# Patient Record
Sex: Male | Born: 1965 | Race: Black or African American | Hispanic: No | Marital: Married | Smoking: Never smoker
Health system: Southern US, Community
[De-identification: ages and names within clinical notes are randomized; demographics above are authoritative.]

## PROBLEM LIST (undated history)

## (undated) DIAGNOSIS — K219 Gastro-esophageal reflux disease without esophagitis: Secondary | ICD-10-CM

## (undated) DIAGNOSIS — E119 Type 2 diabetes mellitus without complications: Secondary | ICD-10-CM

## (undated) DIAGNOSIS — F419 Anxiety disorder, unspecified: Secondary | ICD-10-CM

## (undated) DIAGNOSIS — I10 Essential (primary) hypertension: Secondary | ICD-10-CM

## (undated) DIAGNOSIS — F32A Depression, unspecified: Secondary | ICD-10-CM

## (undated) DIAGNOSIS — H269 Unspecified cataract: Secondary | ICD-10-CM

## (undated) DIAGNOSIS — F329 Major depressive disorder, single episode, unspecified: Secondary | ICD-10-CM

## (undated) HISTORY — DX: Gastro-esophageal reflux disease without esophagitis: K21.9

## (undated) HISTORY — DX: Depression, unspecified: F32.A

## (undated) HISTORY — DX: Unspecified cataract: H26.9

## (undated) HISTORY — DX: Anxiety disorder, unspecified: F41.9

## (undated) HISTORY — DX: Major depressive disorder, single episode, unspecified: F32.9

## (undated) HISTORY — DX: Type 2 diabetes mellitus without complications: E11.9

## (undated) HISTORY — DX: Essential (primary) hypertension: I10

---

## 2002-04-04 ENCOUNTER — Emergency Department (HOSPITAL_COMMUNITY): Admission: EM | Admit: 2002-04-04 | Discharge: 2002-04-04 | Payer: Self-pay | Admitting: Emergency Medicine

## 2003-01-17 ENCOUNTER — Encounter: Payer: Self-pay | Admitting: Emergency Medicine

## 2003-01-17 ENCOUNTER — Emergency Department (HOSPITAL_COMMUNITY): Admission: EM | Admit: 2003-01-17 | Discharge: 2003-01-17 | Payer: Self-pay | Admitting: Emergency Medicine

## 2004-02-13 ENCOUNTER — Emergency Department (HOSPITAL_COMMUNITY): Admission: EM | Admit: 2004-02-13 | Discharge: 2004-02-13 | Payer: Self-pay | Admitting: Emergency Medicine

## 2004-03-03 ENCOUNTER — Emergency Department (HOSPITAL_COMMUNITY): Admission: EM | Admit: 2004-03-03 | Discharge: 2004-03-04 | Payer: Self-pay | Admitting: Emergency Medicine

## 2004-03-04 ENCOUNTER — Ambulatory Visit (HOSPITAL_COMMUNITY): Admission: RE | Admit: 2004-03-04 | Discharge: 2004-03-04 | Payer: Self-pay | Admitting: Emergency Medicine

## 2004-06-01 ENCOUNTER — Emergency Department (HOSPITAL_COMMUNITY): Admission: EM | Admit: 2004-06-01 | Discharge: 2004-06-01 | Payer: Self-pay | Admitting: Emergency Medicine

## 2004-06-04 ENCOUNTER — Ambulatory Visit (HOSPITAL_COMMUNITY): Admission: RE | Admit: 2004-06-04 | Discharge: 2004-06-04 | Payer: Self-pay | Admitting: Emergency Medicine

## 2004-06-10 ENCOUNTER — Emergency Department (HOSPITAL_COMMUNITY): Admission: EM | Admit: 2004-06-10 | Discharge: 2004-06-10 | Payer: Self-pay | Admitting: *Deleted

## 2004-08-12 ENCOUNTER — Emergency Department (HOSPITAL_COMMUNITY): Admission: EM | Admit: 2004-08-12 | Discharge: 2004-08-12 | Payer: Self-pay | Admitting: Emergency Medicine

## 2004-08-13 ENCOUNTER — Emergency Department (HOSPITAL_COMMUNITY): Admission: EM | Admit: 2004-08-13 | Discharge: 2004-08-13 | Payer: Self-pay | Admitting: Emergency Medicine

## 2004-08-14 ENCOUNTER — Ambulatory Visit: Payer: Self-pay | Admitting: Internal Medicine

## 2004-09-10 ENCOUNTER — Emergency Department (HOSPITAL_COMMUNITY): Admission: EM | Admit: 2004-09-10 | Discharge: 2004-09-10 | Payer: Self-pay | Admitting: *Deleted

## 2004-12-02 ENCOUNTER — Emergency Department (HOSPITAL_COMMUNITY): Admission: EM | Admit: 2004-12-02 | Discharge: 2004-12-03 | Payer: Self-pay | Admitting: Emergency Medicine

## 2004-12-04 ENCOUNTER — Ambulatory Visit: Payer: Self-pay | Admitting: Cardiology

## 2004-12-04 ENCOUNTER — Inpatient Hospital Stay (HOSPITAL_COMMUNITY): Admission: EM | Admit: 2004-12-04 | Discharge: 2004-12-06 | Payer: Self-pay | Admitting: Emergency Medicine

## 2004-12-04 ENCOUNTER — Ambulatory Visit: Payer: Self-pay | Admitting: Internal Medicine

## 2005-01-28 ENCOUNTER — Emergency Department (HOSPITAL_COMMUNITY)
Admission: EM | Admit: 2005-01-28 | Discharge: 2005-01-28 | Payer: Self-pay | Admitting: Thoracic Surgery (Cardiothoracic Vascular Surgery)

## 2005-03-11 ENCOUNTER — Emergency Department (HOSPITAL_COMMUNITY): Admission: EM | Admit: 2005-03-11 | Discharge: 2005-03-12 | Payer: Self-pay | Admitting: Emergency Medicine

## 2005-06-13 ENCOUNTER — Emergency Department (HOSPITAL_COMMUNITY): Admission: EM | Admit: 2005-06-13 | Discharge: 2005-06-13 | Payer: Self-pay | Admitting: Emergency Medicine

## 2005-10-08 ENCOUNTER — Emergency Department (HOSPITAL_COMMUNITY): Admission: EM | Admit: 2005-10-08 | Discharge: 2005-10-09 | Payer: Self-pay | Admitting: Emergency Medicine

## 2005-10-18 ENCOUNTER — Emergency Department (HOSPITAL_COMMUNITY): Admission: EM | Admit: 2005-10-18 | Discharge: 2005-10-18 | Payer: Self-pay | Admitting: *Deleted

## 2010-11-17 ENCOUNTER — Encounter: Payer: Self-pay | Admitting: Emergency Medicine

## 2010-11-17 ENCOUNTER — Encounter: Payer: Self-pay | Admitting: Neurology

## 2013-10-26 ENCOUNTER — Emergency Department: Payer: Self-pay | Admitting: Emergency Medicine

## 2013-10-26 LAB — COMPREHENSIVE METABOLIC PANEL
Albumin: 3.9 g/dL (ref 3.4–5.0)
Alkaline Phosphatase: 102 U/L
Anion Gap: 3 — ABNORMAL LOW (ref 7–16)
BUN: 10 mg/dL (ref 7–18)
Bilirubin,Total: 0.6 mg/dL (ref 0.2–1.0)
Calcium, Total: 9.4 mg/dL (ref 8.5–10.1)
Chloride: 100 mmol/L (ref 98–107)
Co2: 32 mmol/L (ref 21–32)
Creatinine: 0.82 mg/dL (ref 0.60–1.30)
EGFR (African American): >60
EGFR (Non-African Amer.): >60
Glucose: 180 mg/dL — ABNORMAL HIGH (ref 65–99)
Osmolality: 274 (ref 275–301)
Potassium: 3.8 mmol/L (ref 3.5–5.1)
SGOT(AST): 19 U/L (ref 15–37)
SGPT (ALT): 22 U/L (ref 12–78)
Sodium: 135 mmol/L — ABNORMAL LOW (ref 136–145)
Total Protein: 8.2 g/dL (ref 6.4–8.2)

## 2013-10-26 LAB — CBC
HCT: 45.2 % (ref 40.0–52.0)
HGB: 15 g/dL (ref 13.0–18.0)
MCH: 27.7 pg (ref 26.0–34.0)
MCHC: 33.2 g/dL (ref 32.0–36.0)
MCV: 83 fL (ref 80–100)
Platelet: 290 10*3/uL (ref 150–440)
RBC: 5.42 10*6/uL (ref 4.40–5.90)
RDW: 14.4 % (ref 11.5–14.5)
WBC: 8.5 10*3/uL (ref 3.8–10.6)

## 2013-10-26 LAB — TROPONIN I: Troponin-I: <0.02 ng/mL

## 2013-12-12 ENCOUNTER — Ambulatory Visit: Payer: Self-pay

## 2014-06-23 ENCOUNTER — Ambulatory Visit: Payer: Self-pay | Admitting: Internal Medicine

## 2014-07-17 ENCOUNTER — Ambulatory Visit: Payer: Self-pay

## 2014-11-14 ENCOUNTER — Emergency Department: Payer: Self-pay | Admitting: Emergency Medicine

## 2014-11-14 LAB — CBC
HCT: 46.7 % (ref 40.0–52.0)
HGB: 14.9 g/dL (ref 13.0–18.0)
MCH: 27.2 pg (ref 26.0–34.0)
MCHC: 31.9 g/dL — ABNORMAL LOW (ref 32.0–36.0)
MCV: 85 fL (ref 80–100)
Platelet: 278 10*3/uL (ref 150–440)
RBC: 5.49 10*6/uL (ref 4.40–5.90)
RDW: 14.2 % (ref 11.5–14.5)
WBC: 9.2 10*3/uL (ref 3.8–10.6)

## 2014-11-14 LAB — COMPREHENSIVE METABOLIC PANEL
Albumin: 3.8 g/dL (ref 3.4–5.0)
Alkaline Phosphatase: 106 U/L
Anion Gap: 7 (ref 7–16)
BUN: 7 mg/dL (ref 7–18)
Bilirubin,Total: 0.4 mg/dL (ref 0.2–1.0)
Calcium, Total: 9.3 mg/dL (ref 8.5–10.1)
Chloride: 103 mmol/L (ref 98–107)
Co2: 29 mmol/L (ref 21–32)
Creatinine: 0.88 mg/dL (ref 0.60–1.30)
EGFR (African American): >60
EGFR (Non-African Amer.): >60
Glucose: 185 mg/dL — ABNORMAL HIGH (ref 65–99)
Osmolality: 280 (ref 275–301)
Potassium: 3.7 mmol/L (ref 3.5–5.1)
SGOT(AST): 20 U/L (ref 15–37)
SGPT (ALT): 20 U/L
Sodium: 139 mmol/L (ref 136–145)
Total Protein: 8.4 g/dL — ABNORMAL HIGH (ref 6.4–8.2)

## 2014-11-14 LAB — URINALYSIS, COMPLETE
Bacteria: NONE SEEN
Bilirubin,UR: NEGATIVE
Blood: NEGATIVE
Glucose,UR: 50 mg/dL (ref 0–75)
Ketone: NEGATIVE
Leukocyte Esterase: NEGATIVE
Nitrite: NEGATIVE
Ph: 5 (ref 4.5–8.0)
Protein: NEGATIVE
RBC,UR: NONE SEEN /HPF (ref 0–5)
Specific Gravity: 1.006 (ref 1.003–1.030)
Squamous Epithelial: NONE SEEN
WBC UR: 1 /HPF (ref 0–5)

## 2014-11-14 LAB — DRUG SCREEN, URINE
Amphetamines, Ur Screen: NEGATIVE (ref ?–1000)
Barbiturates, Ur Screen: NEGATIVE (ref ?–200)
Benzodiazepine, Ur Scrn: NEGATIVE (ref ?–200)
Cannabinoid 50 Ng, Ur ~~LOC~~: NEGATIVE (ref ?–50)
Cocaine Metabolite,Ur ~~LOC~~: POSITIVE (ref ?–300)
MDMA (Ecstasy)Ur Screen: NEGATIVE (ref ?–500)
Methadone, Ur Screen: NEGATIVE (ref ?–300)
Opiate, Ur Screen: NEGATIVE (ref ?–300)
Phencyclidine (PCP) Ur S: NEGATIVE (ref ?–25)
Tricyclic, Ur Screen: NEGATIVE (ref ?–1000)

## 2014-11-14 LAB — ETHANOL: Ethanol: 148 mg/dL

## 2014-11-14 LAB — ACETAMINOPHEN LEVEL: Acetaminophen: <2 ug/mL

## 2014-11-14 LAB — SALICYLATE LEVEL: Salicylates, Serum: <1.7 mg/dL

## 2014-11-15 LAB — ETHANOL: Ethanol: <3 mg/dL

## 2014-11-20 ENCOUNTER — Emergency Department: Payer: Self-pay | Admitting: Emergency Medicine

## 2014-11-21 LAB — URINALYSIS, COMPLETE
Bacteria: NONE SEEN
Bilirubin,UR: NEGATIVE
Blood: NEGATIVE
Glucose,UR: NEGATIVE mg/dL (ref 0–75)
Ketone: NEGATIVE
Leukocyte Esterase: NEGATIVE
Nitrite: NEGATIVE
Ph: 5 (ref 4.5–8.0)
Protein: NEGATIVE
RBC,UR: <1 /HPF (ref 0–5)
Specific Gravity: 1.003 (ref 1.003–1.030)
Squamous Epithelial: NONE SEEN
WBC UR: 1 /HPF (ref 0–5)

## 2014-11-21 LAB — DRUG SCREEN, URINE

## 2014-11-21 LAB — CBC
HCT: 44.7 % (ref 40.0–52.0)
HGB: 14.1 g/dL (ref 13.0–18.0)
MCH: 26.3 pg (ref 26.0–34.0)
MCHC: 31.5 g/dL — ABNORMAL LOW (ref 32.0–36.0)
MCV: 84 fL (ref 80–100)
Platelet: 285 10*3/uL (ref 150–440)
RBC: 5.36 10*6/uL (ref 4.40–5.90)
RDW: 13.8 % (ref 11.5–14.5)
WBC: 9.1 10*3/uL (ref 3.8–10.6)

## 2014-11-21 LAB — COMPREHENSIVE METABOLIC PANEL
Albumin: 3.9 g/dL (ref 3.4–5.0)
Alkaline Phosphatase: 95 U/L (ref 46–116)
Anion Gap: 9 (ref 7–16)
BUN: 10 mg/dL (ref 7–18)
Bilirubin,Total: 0.3 mg/dL (ref 0.2–1.0)
Calcium, Total: 9.3 mg/dL (ref 8.5–10.1)
Chloride: 99 mmol/L (ref 98–107)
Co2: 29 mmol/L (ref 21–32)
Creatinine: 1.06 mg/dL (ref 0.60–1.30)
EGFR (African American): >60
EGFR (Non-African Amer.): >60
Glucose: 174 mg/dL — ABNORMAL HIGH (ref 65–99)
Osmolality: 277 (ref 275–301)
Potassium: 3.6 mmol/L (ref 3.5–5.1)
SGOT(AST): 13 U/L — ABNORMAL LOW (ref 15–37)
SGPT (ALT): 18 U/L (ref 14–63)
Sodium: 137 mmol/L (ref 136–145)
Total Protein: 8.3 g/dL — ABNORMAL HIGH (ref 6.4–8.2)

## 2014-11-21 LAB — SALICYLATE LEVEL: Salicylates, Serum: <1.7 mg/dL

## 2014-11-21 LAB — ETHANOL: Ethanol: 141 mg/dL

## 2014-11-21 LAB — ACETAMINOPHEN LEVEL: Acetaminophen: <2 ug/mL

## 2014-12-30 ENCOUNTER — Emergency Department: Payer: Self-pay | Admitting: Emergency Medicine

## 2015-01-18 ENCOUNTER — Emergency Department: Payer: Self-pay | Admitting: Emergency Medicine

## 2015-01-18 LAB — CBC
HCT: 43.8 % (ref 40.0–52.0)
HGB: 13.8 g/dL (ref 13.0–18.0)
MCH: 26.6 pg (ref 26.0–34.0)
MCHC: 31.4 g/dL — ABNORMAL LOW (ref 32.0–36.0)
MCV: 85 fL (ref 80–100)
Platelet: 262 10*3/uL (ref 150–440)
RBC: 5.18 10*6/uL (ref 4.40–5.90)
RDW: 16.1 % — ABNORMAL HIGH (ref 11.5–14.5)
WBC: 9 10*3/uL (ref 3.8–10.6)

## 2015-01-18 LAB — COMPREHENSIVE METABOLIC PANEL
Albumin: 4 g/dL
Alkaline Phosphatase: 100 U/L
Anion Gap: 10 (ref 7–16)
BUN: 6 mg/dL
Bilirubin,Total: 0.6 mg/dL
Calcium, Total: 9.3 mg/dL
Chloride: 101 mmol/L
Co2: 27 mmol/L
Creatinine: 0.88 mg/dL
EGFR (African American): >60
EGFR (Non-African Amer.): >60
Glucose: 211 mg/dL — ABNORMAL HIGH
Potassium: 3.7 mmol/L
SGOT(AST): 20 U/L
SGPT (ALT): 20 U/L
Sodium: 138 mmol/L
Total Protein: 7.9 g/dL

## 2015-01-18 LAB — ETHANOL: Ethanol: 231 mg/dL

## 2015-01-19 LAB — DRUG SCREEN, URINE
Amphetamines, Ur Screen: NEGATIVE
Barbiturates, Ur Screen: NEGATIVE
Benzodiazepine, Ur Scrn: POSITIVE
Cannabinoid 50 Ng, Ur ~~LOC~~: NEGATIVE
Cocaine Metabolite,Ur ~~LOC~~: NEGATIVE
MDMA (Ecstasy)Ur Screen: NEGATIVE
Methadone, Ur Screen: NEGATIVE
Opiate, Ur Screen: NEGATIVE
Phencyclidine (PCP) Ur S: NEGATIVE
Tricyclic, Ur Screen: NEGATIVE

## 2015-01-19 LAB — URINALYSIS, COMPLETE
Bacteria: NONE SEEN
Bilirubin,UR: NEGATIVE
Blood: NEGATIVE
Glucose,UR: 50 mg/dL (ref 0–75)
Ketone: NEGATIVE
Leukocyte Esterase: NEGATIVE
Nitrite: NEGATIVE
Ph: 6 (ref 4.5–8.0)
Protein: NEGATIVE
RBC,UR: NONE SEEN /HPF (ref 0–5)
Specific Gravity: 1.002 (ref 1.003–1.030)
Squamous Epithelial: NONE SEEN
WBC UR: <1 /HPF (ref 0–5)

## 2015-02-25 NOTE — Consult Note (Signed)
Brief Consult Note: Diagnosis: alcohol abuse, depressioin nos.   Patient was seen by consultant.   Consult note dictated.   Orders entered.   Discussed with Attending MD.   Comments: Psychiatry: PAtient seen and chart reviewed. Patient sad but lucid and not suicidal. Calm. No hx of seizures or dts. Established with [facility].  Restart celexa 20mg  daily and patient being referred to [facility].  Electronic Signatures: Gonzella Lex (MD)  (Signed 20-Jan-16 13:57)  Authored: Brief Consult Note   Last Updated: 20-Jan-16 13:57 by Gonzella Lex (MD)

## 2015-02-25 NOTE — Consult Note (Signed)
PATIENT NAME:  Manuel Williamson, Manuel Williamson MR#:  [id number] DATE OF BIRTH:  1966-02-07  DATE OF CONSULTATION:  01/18/2015  REFERRING PHYSICIAN:   CONSULTING PHYSICIAN:  Gonzella Lex, MD  IDENTIFYING INFORMATION AND REASON FOR CONSULTATION: This is a 49 year old man who presents to the Emergency Room with a chief complaint, "I've been drinking."   HISTORY OF PRESENT ILLNESS: Information from the patient and the chart. The patient says yesterday he was drinking and consumed a 40 ounce bottle of beer and a bottle of wine. He says he drinks about 3 times a week. It is getting to be more of a problem for him because now he is homeless. He had been staying with his mother-in-law, but she put him out of the house. The patient says his mood is feeling a little bit down. He denies any suicidal ideation. Denies any suicidal intent or plan. Denies having any psychotic symptoms. Says he uses crack cocaine intermittently also, but says that he has been off that for some time. Denies other drug use. The patient states several times that his chief concern is getting his disability, which he thinks will solve all of his problems because then he will have a place to live on money to live on.   PAST PSYCHIATRIC HISTORY: He has been prescribed Celexa for depression. Says that he has sometimes taken pills but says this in a very vague way. No really clear history of suicide attempts. Denies history of psychosis. Unclear if he has ever been hospitalized except for alcohol withdrawal.   PAST MEDICAL HISTORY: Diabetes, on an oral medicine, and high blood pressure.   SOCIAL HISTORY: He is married but his wife is currently in jail. He thinks she will probably be there for the next few months which has put him out of a place to live. He is not working currently. He has been to several rehabilitation programs recently including the [facility], [facility], and [facility] but has not followed up with outpatient  treatment recently.   CURRENT MEDICATIONS: Says he is taking Celexa, metformin, hydrochlorothiazide, and lisinopril.   ALLERGIES: No known drug allergies.   REVIEW OF SYSTEMS: Says that his mood is a little bit down. Denies suicidal ideation. Denies homicidal ideation. Denies auditory or visual hallucinations. Feeling sick to his stomach but not throwing up. No other acute physical complaints.   MENTAL STATUS EXAMINATION: Reasonably well groomed man, looks his stated age, cooperative with the interview. Eye contact good. Psychomotor activity a little fidgety. Speech normal rate, tone, and volume. Affect is blunted, but not bizarre. Mood is stated as being all right. Thoughts are lucid without loosening of associations or delusions. Denies auditory or visual hallucinations. Denies suicidal or homicidal ideation. He is alert and oriented x 4. Can repeat 3 words immediately, remembers all 3 at 3 minutes. Judgment and insight are intact. Intelligence and fund of knowledge normal.   LABORATORY RESULTS: Urinalysis: A little bit of glucose, otherwise unremarkable. Drug screen positive for benzodiazepines. Alcohol level on presentation last night 231. CBC normal. Chemistry panel: Elevated glucose 210.   VITAL SIGNS: Blood pressure 134/60, respirations 18, pulse 86, temperature 98.7.   ASSESSMENT: A 49 year old man with a history of alcohol abuse, who is presenting here because he has no place currently to live. He tried going to the homeless shelter and was told that he was not eligible to stay there anymore. The patient is not reporting any suicidal ideation. He is not showing  psychotic symptoms. He is not showing signs of acute withdrawal and has no history of complicated alcohol withdrawal with no medical problems requiring hospitalization.   TREATMENT PLAN: The patient does not require inpatient hospitalization. Counseling done and supportive therapy about substance abuse and alcohol abuse. The patient  encouraged to follow up with [facility] in the community and to discontinue alcohol completely. The case discussed with Emergency Room doctor. The patient can be released from the Emergency Room.   DIAGNOSIS, PRINCIPAL AND PRIMARY:  AXIS I: Alcohol abuse, moderate.   SECONDARY DIAGNOSES: AXIS I: Cocaine abuse, in early remission.  AXIS II: Deferred.  AXIS III: Hypertension and diabetes.     ____________________________ Gonzella Lex, MD jtc:at D: 01/19/2015 13:26:28 ET T: 01/19/2015 13:40:03 ET JOB#: [id number]  cc: Gonzella Lex, MD, <Dictator> Gonzella Lex MD ELECTRONICALLY SIGNED 01/29/2015 9:58

## 2015-04-11 ENCOUNTER — Ambulatory Visit: Payer: Self-pay | Admitting: Internal Medicine

## 2015-04-11 DIAGNOSIS — E119 Type 2 diabetes mellitus without complications: Secondary | ICD-10-CM | POA: Insufficient documentation

## 2015-04-11 DIAGNOSIS — I10 Essential (primary) hypertension: Secondary | ICD-10-CM | POA: Insufficient documentation

## 2015-04-11 LAB — LIPID PANEL
Cholesterol: 240 mg/dL — AB (ref 0–200)
HDL: 55 mg/dL (ref 35–70)
LDL CALC: 155 mg/dL
TRIGLYCERIDES: 152 mg/dL (ref 40–160)

## 2015-04-11 LAB — HEPATIC FUNCTION PANEL
ALK PHOS: 106 U/L (ref 25–125)
ALT: 12 U/L (ref 10–40)
AST: 13 U/L — AB (ref 14–40)
BILIRUBIN, TOTAL: 0.4 mg/dL

## 2015-04-11 LAB — BASIC METABOLIC PANEL
BUN: 11 mg/dL (ref 4–21)
CREATININE: 0.8 mg/dL (ref 0.6–1.3)
Glucose: 204 mg/dL
Potassium: 4.5 mmol/L (ref 3.4–5.3)
SODIUM: 139 mmol/L (ref 137–147)

## 2015-04-11 LAB — CBC AND DIFFERENTIAL
HCT: 42 % (ref 41–53)
HEMOGLOBIN: 14 g/dL (ref 13.5–17.5)
NEUTROS ABS: 5 /uL
PLATELETS: 325 10*3/uL (ref 150–399)
WBC: 7.6 10*3/mL

## 2015-04-11 LAB — MICROALBUMIN, URINE: MICROALB UR: 13.2

## 2015-04-11 LAB — HEMOGLOBIN A1C: Hemoglobin A1C: 9.1

## 2015-04-11 LAB — TSH: TSH: 2.09 u[IU]/mL (ref 0.41–5.90)

## 2015-05-16 ENCOUNTER — Ambulatory Visit: Payer: Self-pay | Admitting: Internal Medicine

## 2015-05-23 ENCOUNTER — Ambulatory Visit: Payer: Self-pay | Admitting: Internal Medicine

## 2015-06-25 ENCOUNTER — Other Ambulatory Visit: Payer: Self-pay | Admitting: *Deleted

## 2015-06-25 ENCOUNTER — Ambulatory Visit
Admission: RE | Admit: 2015-06-25 | Discharge: 2015-06-25 | Disposition: A | Discharge status: Home / Self Care | Payer: Disability Insurance | Source: Ambulatory Visit | Attending: Diagnostic Radiology | Admitting: Diagnostic Radiology

## 2015-06-25 DIAGNOSIS — M25561 Pain in right knee: Secondary | ICD-10-CM

## 2016-05-12 DIAGNOSIS — I10 Essential (primary) hypertension: Secondary | ICD-10-CM

## 2016-05-12 DIAGNOSIS — E119 Type 2 diabetes mellitus without complications: Secondary | ICD-10-CM

## 2017-10-27 HISTORY — PX: BOWEL RESECTION: SHX1257

## 2018-05-07 ENCOUNTER — Emergency Department
Admission: EM | Admit: 2018-05-07 | Discharge: 2018-05-07 | Disposition: A | Discharge status: Home / Self Care | Payer: 59 | Attending: Emergency Medicine | Admitting: Emergency Medicine

## 2018-05-07 ENCOUNTER — Other Ambulatory Visit: Payer: Self-pay

## 2018-05-07 ENCOUNTER — Emergency Department: Payer: 59

## 2018-05-07 DIAGNOSIS — E119 Type 2 diabetes mellitus without complications: Secondary | ICD-10-CM | POA: Diagnosis not present

## 2018-05-07 DIAGNOSIS — C189 Malignant neoplasm of colon, unspecified: Secondary | ICD-10-CM | POA: Diagnosis not present

## 2018-05-07 DIAGNOSIS — R1031 Right lower quadrant pain: Secondary | ICD-10-CM | POA: Diagnosis present

## 2018-05-07 DIAGNOSIS — I10 Essential (primary) hypertension: Secondary | ICD-10-CM | POA: Diagnosis not present

## 2018-05-07 DIAGNOSIS — R160 Hepatomegaly, not elsewhere classified: Secondary | ICD-10-CM | POA: Diagnosis not present

## 2018-05-07 DIAGNOSIS — Z85038 Personal history of other malignant neoplasm of large intestine: Secondary | ICD-10-CM | POA: Insufficient documentation

## 2018-05-07 DIAGNOSIS — C787 Secondary malignant neoplasm of liver and intrahepatic bile duct: Secondary | ICD-10-CM | POA: Insufficient documentation

## 2018-05-07 LAB — COMPREHENSIVE METABOLIC PANEL
ALK PHOS: 187 U/L — AB (ref 38–126)
ALT: 23 U/L (ref 0–44)
ANION GAP: 10 (ref 5–15)
AST: 40 U/L (ref 15–41)
Albumin: 3.7 g/dL (ref 3.5–5.0)
BUN: 14 mg/dL (ref 6–20)
CO2: 25 mmol/L (ref 22–32)
Calcium: 9.4 mg/dL (ref 8.9–10.3)
Chloride: 102 mmol/L (ref 98–111)
Creatinine, Ser: 0.91 mg/dL (ref 0.61–1.24)
Glucose, Bld: 80 mg/dL (ref 70–99)
Potassium: 4 mmol/L (ref 3.5–5.1)
SODIUM: 137 mmol/L (ref 135–145)
TOTAL PROTEIN: 7.5 g/dL (ref 6.5–8.1)
Total Bilirubin: 0.8 mg/dL (ref 0.3–1.2)

## 2018-05-07 LAB — CBC WITH DIFFERENTIAL/PLATELET
Basophils Absolute: 0.1 10*3/uL (ref 0–0.1)
Basophils Relative: 1 %
EOS ABS: 0.1 10*3/uL (ref 0–0.7)
EOS PCT: 2 %
HCT: 31.4 % — ABNORMAL LOW (ref 40.0–52.0)
HEMOGLOBIN: 10.2 g/dL — AB (ref 13.0–18.0)
LYMPHS ABS: 0.4 10*3/uL — AB (ref 1.0–3.6)
LYMPHS PCT: 8 %
MCH: 26 pg (ref 26.0–34.0)
MCHC: 32.7 g/dL (ref 32.0–36.0)
MCV: 79.6 fL — AB (ref 80.0–100.0)
MONOS PCT: 15 %
Monocytes Absolute: 0.7 10*3/uL (ref 0.2–1.0)
Neutro Abs: 3.9 10*3/uL (ref 1.4–6.5)
Neutrophils Relative %: 74 %
Platelets: 423 10*3/uL (ref 150–440)
RBC: 3.94 MIL/uL — ABNORMAL LOW (ref 4.40–5.90)
RDW: 17.9 % — ABNORMAL HIGH (ref 11.5–14.5)
WBC: 5.2 10*3/uL (ref 3.8–10.6)

## 2018-05-07 LAB — URINALYSIS, COMPLETE (UACMP) WITH MICROSCOPIC
BACTERIA UA: NONE SEEN
BILIRUBIN URINE: NEGATIVE
GLUCOSE, UA: NEGATIVE mg/dL
HGB URINE DIPSTICK: NEGATIVE
Ketones, ur: NEGATIVE mg/dL
LEUKOCYTES UA: NEGATIVE
NITRITE: NEGATIVE
PH: 6 (ref 5.0–8.0)
Protein, ur: NEGATIVE mg/dL
SPECIFIC GRAVITY, URINE: 1.015 (ref 1.005–1.030)

## 2018-05-07 LAB — URINE DRUG SCREEN, QUALITATIVE (ARMC ONLY)
Amphetamines, Ur Screen: NOT DETECTED
Benzodiazepine, Ur Scrn: NOT DETECTED
CANNABINOID 50 NG, UR ~~LOC~~: NOT DETECTED
COCAINE METABOLITE, UR ~~LOC~~: NOT DETECTED
MDMA (Ecstasy)Ur Screen: NOT DETECTED
Methadone Scn, Ur: NOT DETECTED
OPIATE, UR SCREEN: NOT DETECTED
PHENCYCLIDINE (PCP) UR S: NOT DETECTED
Tricyclic, Ur Screen: NOT DETECTED

## 2018-05-07 MED ORDER — OXYCODONE-ACETAMINOPHEN 5-325 MG PO TABS: 1.0000 | ORAL_TABLET | ORAL | 0 refills | Status: DC | PRN

## 2018-05-07 MED ORDER — ONDANSETRON HCL 4 MG/2ML IJ SOLN
4.0000 mg | Freq: Once | INTRAMUSCULAR | Status: AC
  Administered 2018-05-07: 4 mg via INTRAVENOUS
  Filled 2018-05-07: qty 2

## 2018-05-07 MED ORDER — HYDROCHLOROTHIAZIDE 25 MG PO TABS: 25.0000 mg | ORAL_TABLET | Freq: Every day | ORAL | 1 refills | Status: DC

## 2018-05-07 MED ORDER — HYDROMORPHONE HCL 1 MG/ML IJ SOLN
INTRAMUSCULAR | Status: AC
  Administered 2018-05-07: 1 mg via INTRAVENOUS
  Filled 2018-05-07: qty 1

## 2018-05-07 MED ORDER — HYDROMORPHONE HCL 1 MG/ML IJ SOLN
1.0000 mg | Freq: Once | INTRAMUSCULAR | Status: AC
Start: 2018-05-07 — End: 2018-05-07
  Administered 2018-05-07: 1 mg via INTRAVENOUS

## 2018-05-07 MED ORDER — SODIUM CHLORIDE 0.9 % IV BOLUS
1000.0000 mL | Freq: Once | INTRAVENOUS | Status: AC
  Administered 2018-05-07: 1000 mL via INTRAVENOUS

## 2018-05-07 MED ORDER — LOPERAMIDE HCL 2 MG PO TABS: 2.0000 mg | ORAL_TABLET | Freq: Four times a day (QID) | ORAL | 0 refills | Status: AC | PRN

## 2018-05-07 MED ORDER — IOHEXOL 300 MG/ML  SOLN
100.0000 mL | Freq: Once | INTRAMUSCULAR | Status: AC | PRN
  Administered 2018-05-07: 100 mL via INTRAVENOUS

## 2018-05-07 MED ORDER — HYDROCHLOROTHIAZIDE 25 MG PO TABS
25.0000 mg | ORAL_TABLET | Freq: Once | ORAL | Status: AC
  Administered 2018-05-07: 25 mg via ORAL
  Filled 2018-05-07: qty 1

## 2018-05-07 NOTE — ED Notes (Signed)
Called lab to check on urine, as it was sent some time ago. Lab tech states they have it and it should be processing soon.

## 2018-05-07 NOTE — ED Provider Notes (Signed)
[facility] Emergency Department Provider Note  ____________________________________________  Time seen: Approximately 12:07 PM  I have reviewed the triage vital signs and the nursing notes.   HISTORY  Chief Complaint Abdominal Pain   HPI Manuel Williamson is a 52 y.o. male with a history of hypertension, diabetes, colon cancer who presents for evaluation of right-sided abdominal pain.  Patient reports having an ostomy takedown from bowel resection for his colon cancer done in January 2019.  Since then he has had several daily episodes of diarrhea for which he takes Imodium.  For the last 7 days is complaining of a pulling sensation that is constant located in the right lower quadrant at the previous site of his ostomy.  He reports also chills but no fever and some mild nausea but still having a normal appetite and eating normally.  Patient denies dysuria or hematuria.  History is very limited since patient does not know a lot of his medical history.  He tells me that his care was done in [place] but does not know which hospital.  He tells me that he is not supposed to be undergoing any treatment for his cancer.  He endorses a history of crack, cocaine, and alcohol abuse however has not done any in the last 30 days.  Past Medical History:  Diagnosis Date  . Anxiety   . Cataract   . Depression   . Diabetes mellitus without complication ([place])   . GERD (gastroesophageal reflux disease)   . Hypertension     Patient Active Problem List   Diagnosis Date Noted  . Diabetes ([place]) 04/11/2015  . Hypertension 04/11/2015    Prior to Admission medications   Medication Sig Start Date End Date Taking? Authorizing Provider  hydrochlorothiazide (HYDRODIURIL) 25 MG tablet Take 1 tablet (25 mg total) by mouth daily. 05/07/18   Rudene Re, MD  loperamide (IMODIUM A-D) 2 MG tablet Take 1 tablet (2 mg total) by mouth 4 (four) times daily as needed for diarrhea or loose  stools. 05/07/18   Rudene Re, MD  oxyCODONE-acetaminophen (PERCOCET) 5-325 MG tablet Take 1 tablet by mouth every 4 (four) hours as needed for severe pain. 05/07/18   Rudene Re, MD    Allergies Patient has no known allergies.  History reviewed. No pertinent family history.  Social History Social History   Tobacco Use  . Smoking status: Never Smoker  . Smokeless tobacco: Former Network engineer Use Topics  . Alcohol use: No    Comment: Former use.   . Drug use: Not Currently    Types: Cocaine    Comment: Pt states former user, does not specify when he quit    Review of Systems  Constitutional: Negative for fever. + chills Eyes: Negative for visual changes. ENT: Negative for sore throat. Neck: No neck pain  Cardiovascular: Negative for chest pain. Respiratory: Negative for shortness of breath. Gastrointestinal: + RLQ abdominal pain, nausea. No vomiting. Genitourinary: Negative for dysuria. Musculoskeletal: Negative for back pain. Skin: Negative for rash. Neurological: Negative for headaches, weakness or numbness. Psych: No SI or HI  ____________________________________________   PHYSICAL EXAM:  VITAL SIGNS: ED Triage Vitals  Enc Vitals Group     BP 05/07/18 1130 (!) 150/96     Pulse --      Resp 05/07/18 1130 14     Temp 05/07/18 1130 97.8 F (36.6 C)     Temp Source 05/07/18 1130 Oral     SpO2 --  Weight 05/07/18 1131 234 lb (106.1 kg)     Height 05/07/18 1131 5\' 10"  (1.778 m)     Head Circumference --      Peak Flow --      Pain Score 05/07/18 1130 6     Pain Loc --      Pain Edu? --      Excl. in [place]? --     Constitutional: Alert and oriented. Well appearing and in no apparent distress. HEENT:      Head: Normocephalic and atraumatic.         Eyes: Conjunctivae are normal. Sclera is non-icteric.       Mouth/Throat: Mucous membranes are moist.       Neck: Supple with no signs of meningismus. Cardiovascular: Regular rate and rhythm.  No murmurs, gallops, or rubs. 2+ symmetrical distal pulses are present in all extremities. No JVD. Respiratory: Normal respiratory effort. Lungs are clear to auscultation bilaterally. No wheezes, crackles, or rhonchi.  Gastrointestinal: Soft, non tender, and non distended with positive bowel sounds. No rebound or guarding. Musculoskeletal: Nontender with normal range of motion in all extremities. No edema, cyanosis, or erythema of extremities. Neurologic: Normal speech and language. Face is symmetric. Moving all extremities. No gross focal neurologic deficits are appreciated. Skin: Skin is warm, dry and intact. No rash noted. Psychiatric: Mood and affect are normal. Speech and behavior are normal.  ____________________________________________   LABS (all labs ordered are listed, but only abnormal results are displayed)  Labs Reviewed  CBC WITH DIFFERENTIAL/PLATELET - Abnormal; Notable for the following components:      Result Value   RBC 3.94 (*)    Hemoglobin 10.2 (*)    HCT 31.4 (*)    MCV 79.6 (*)    RDW 17.9 (*)    Lymphs Abs 0.4 (*)    All other components within normal limits  COMPREHENSIVE METABOLIC PANEL - Abnormal; Notable for the following components:   Alkaline Phosphatase 187 (*)    All other components within normal limits  URINALYSIS, COMPLETE (UACMP) WITH MICROSCOPIC - Abnormal; Notable for the following components:   Color, Urine YELLOW (*)    APPearance CLEAR (*)    All other components within normal limits  URINE DRUG SCREEN, QUALITATIVE ([facility] ONLY) - Abnormal; Notable for the following components:   Barbiturates, Ur Screen   (*)    Value: Result not available. Reagent lot number recalled by manufacturer.   All other components within normal limits   ____________________________________________  EKG  none  ____________________________________________  RADIOLOGY  I have personally reviewed the images performed during this visit and I agree with the  Radiologist's read.   Interpretation by Radiologist:  Ct Abdomen Pelvis W Contrast  Result Date: 05/07/2018 CLINICAL DATA:  Nausea, vomiting and diarrhea. Patient diagnosed with colon cancer in January, 2019. Status post colon resection. EXAM: CT ABDOMEN AND PELVIS WITH CONTRAST TECHNIQUE: Multidetector CT imaging of the abdomen and pelvis was performed using the standard protocol following bolus administration of intravenous contrast. CONTRAST:  100 ml OMNIPAQUE IOHEXOL 300 MG/ML  SOLN COMPARISON:  None. FINDINGS: Lower chest: Heart size is upper normal. No pleural or pericardial effusion. Mild dependent atelectasis is seen in the lung bases. Hepatobiliary: Multiple large confluent masses are identified in the liver. For example, lesion in the posterior right hepatic lobe measures 10 cm transverse by 11 cm AP on image 18. The liver is enlarged by these masses measuring 24 cm craniocaudal and displacing the pancreas to the left  and right kidney inferiorly. The gallbladder and biliary tree are unremarkable. Pancreas: Unremarkable. No pancreatic ductal dilatation or surrounding inflammatory changes. Spleen: Normal in size without focal abnormality. Adrenals/Urinary Tract: Nonobstructing stone midpole left kidney measures 0.5 cm in diameter. The kidneys are otherwise unremarkable. Wall thickening is seen in almost completely decompressed urinary bladder. The adrenal glands are unremarkable. Stomach/Bowel: Surgical anastomosis near the rectosigmoid colon is consistent with prior colon resection. There also appears to be a small bowel anastomosis in the pelvis. No bowel obstruction or inflammatory change. The stomach is unremarkable. The appendix is normal. Vascular/Lymphatic: Scattered atherosclerotic calcifications are seen. No lymphadenopathy. Reproductive: Prostate is unremarkable. Other: No intra-abdominal fluid collection or free air. Musculoskeletal: No lytic or sclerotic lesion is identified. Severe  bilateral hip osteoarthritis is advanced for age. Degenerative disc disease L1-2 and L4-5 is also identified. IMPRESSION: Multiple large confluent liver masses most likely due to metastatic disease in this patient with a history of colon cancer. The masses result in hepatomegaly and inferior displacement of the right kidney and leftward displacement of the pancreas. Postoperative change of the colon without evidence of complication. Nonobstructing stone midpole left kidney. Wall thickening of the urinary bladder is likely related to underdistention rather than cystitis. 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 Electronically Signed   By: Inge Rise M.D.   On: 05/07/2018 13:30      ____________________________________________   PROCEDURES  Procedure(s) performed: None Procedures Critical Care performed:  None ____________________________________________   INITIAL IMPRESSION / ASSESSMENT AND PLAN / ED COURSE   53 y.o. male with a history of hypertension, diabetes, colon cancer who presents for evaluation of right-sided abdominal pain x 7 days associated with nausea and chills.  Patient is well-appearing, no distress, has normal vital signs, abdomen is soft with no tenderness on exam.  Due to patient's past surgical history and history of colon cancer we will pursue a CT abdomen pelvis to rule out any internal hernias, intra-abdominal abscess, recurrence of malignancy, diverticulitis.  Clinical Course as of May 08 1527  Fri May 07, 2018  1523 CT concerning for several metastatic lesions in the liver causing hepatomegaly. Discussed with Dr. Grayland Ormond, oncologist for close outpatient management and he will see patient on Monday.  Patient's wife is now at bedside and tells me the patient underwent radiation therapy and resection back in [place] and was supposed to have undergone chemotherapy however patient declined any treatment and never went back for follow-up.   Is also not taking any of his medications including his antihypertensives.  I showed to the patient his CT scan and all the lesion seen in the liver.  Patient is asking for refill of his antihypertensive medications and referral to oncology so he can establish care.  At this time there is no indication for inpatient.  Patient will be discharged home and follow-up with oncology.   [CV]    Clinical Course User Index [CV] Alfred Levins [place], MD     As part of my medical decision making, I reviewed the following data within the [place] History obtained from family, Nursing notes reviewed and incorporated, Labs reviewed , Radiograph reviewed , A consult was requested and obtained from this/these consultant(s) Oncology, Notes from prior ED visits and [place] Controlled Substance Database    Pertinent labs & imaging results that were available during my care of the patient were reviewed by me and considered in my medical decision making (see chart for details).    ____________________________________________  FINAL CLINICAL IMPRESSION(S) / ED DIAGNOSES  Final diagnoses:  Metastatic colon cancer to liver ([place])  Hepatomegaly      NEW MEDICATIONS STARTED DURING THIS VISIT:  ED Discharge Orders        Ordered    hydrochlorothiazide (HYDRODIURIL) 25 MG tablet  Daily     05/07/18 1524    oxyCODONE-acetaminophen (PERCOCET) 5-325 MG tablet  Every 4 hours PRN     05/07/18 1528    loperamide (IMODIUM A-D) 2 MG tablet  4 times daily PRN     05/07/18 1528       Note:  This document was prepared using Dragon voice recognition software and may include unintentional dictation errors.    Alfred Levins, [place], MD 05/07/18 [phone]

## 2018-05-11 ENCOUNTER — Telehealth: Payer: Self-pay

## 2018-05-11 ENCOUNTER — Inpatient Hospital Stay: Payer: 59 | Attending: Oncology | Admitting: Oncology

## 2018-05-11 ENCOUNTER — Inpatient Hospital Stay: Payer: 59

## 2018-05-11 ENCOUNTER — Other Ambulatory Visit: Payer: Self-pay

## 2018-05-11 ENCOUNTER — Encounter: Payer: Self-pay | Admitting: Oncology

## 2018-05-11 VITALS — BP 137/88 | HR 78 | Temp 95.6°F | Resp 18 | Ht 65.75 in | Wt 164.7 lb

## 2018-05-11 DIAGNOSIS — C189 Malignant neoplasm of colon, unspecified: Secondary | ICD-10-CM | POA: Diagnosis present

## 2018-05-11 DIAGNOSIS — K921 Melena: Secondary | ICD-10-CM | POA: Diagnosis not present

## 2018-05-11 DIAGNOSIS — Z79899 Other long term (current) drug therapy: Secondary | ICD-10-CM | POA: Diagnosis not present

## 2018-05-11 DIAGNOSIS — I10 Essential (primary) hypertension: Secondary | ICD-10-CM | POA: Insufficient documentation

## 2018-05-11 DIAGNOSIS — C787 Secondary malignant neoplasm of liver and intrahepatic bile duct: Secondary | ICD-10-CM | POA: Diagnosis not present

## 2018-05-11 DIAGNOSIS — K219 Gastro-esophageal reflux disease without esophagitis: Secondary | ICD-10-CM | POA: Insufficient documentation

## 2018-05-11 DIAGNOSIS — Z9049 Acquired absence of other specified parts of digestive tract: Secondary | ICD-10-CM | POA: Insufficient documentation

## 2018-05-11 DIAGNOSIS — D649 Anemia, unspecified: Secondary | ICD-10-CM | POA: Diagnosis not present

## 2018-05-11 DIAGNOSIS — F419 Anxiety disorder, unspecified: Secondary | ICD-10-CM | POA: Insufficient documentation

## 2018-05-11 DIAGNOSIS — Z7189 Other specified counseling: Secondary | ICD-10-CM

## 2018-05-11 DIAGNOSIS — F329 Major depressive disorder, single episode, unspecified: Secondary | ICD-10-CM | POA: Insufficient documentation

## 2018-05-11 DIAGNOSIS — E119 Type 2 diabetes mellitus without complications: Secondary | ICD-10-CM | POA: Insufficient documentation

## 2018-05-11 DIAGNOSIS — R197 Diarrhea, unspecified: Secondary | ICD-10-CM | POA: Insufficient documentation

## 2018-05-11 DIAGNOSIS — R160 Hepatomegaly, not elsewhere classified: Secondary | ICD-10-CM

## 2018-05-11 LAB — PROTIME-INR
INR: 1.21
Prothrombin Time: 15.2 seconds (ref 11.4–15.2)

## 2018-05-11 LAB — APTT: aPTT: 31 seconds (ref 24–36)

## 2018-05-11 NOTE — Telephone Encounter (Signed)
Call placed to Manuel Williamson to inquire if reason he can not come is due to transportation. He states it is. We have arranged for the cancer center van to pick him up from the shelter and bring him in. His spouse is unable to come with him to appointment today. He is poor historian and does not know much regarding his cancer history. Oncology Nurse Navigator Documentation  Navigator Location: [facility] (05/11/18 1059)   )Navigator Encounter Type: Telephone (05/11/18 1059) Telephone: [name] (05/11/18 1059)                                                  Time Spent with Patient: 30 (05/11/18 1059)

## 2018-05-11 NOTE — Telephone Encounter (Signed)
Call placed to Manuel Williamson to attempt to obtain further medical history regarding surgery that he had and cancer history. He states he thought his appointment was tomorrow and would like to reschedule. He is currently staying in local shelter. His spouse will be I later and can help provide medical history. Oncology Nurse Navigator Documentation  Navigator Location: [facility] (05/11/18 1000)   )Navigator Encounter Type: Telephone (05/11/18 1000) Telephone: Outgoing Call (05/11/18 1000)                                                  Time Spent with Patient: 15 (05/11/18 1000)

## 2018-05-11 NOTE — Progress Notes (Signed)
Pt here for new pt  Evaluation. Per pt he is currently ( x1 mo) living at [place] (from [place] recently  /originally from [place] ) stated that his mind"slips in and out"so cant recall events of surgery in [place] many medical details )  Stated came here w his wife who got" kicked out of the shelter".   In NAD at this time. Stated " a Dr sent me here but due to "mind slipping " cant recall who sent him here.

## 2018-05-12 LAB — CEA: CEA: 81.2 ng/mL — ABNORMAL HIGH (ref 0.0–4.7)

## 2018-05-12 LAB — CANCER ANTIGEN 19-9: CAN 19-9: 373 U/mL — AB (ref 0–35)

## 2018-05-13 ENCOUNTER — Other Ambulatory Visit: Payer: Self-pay | Admitting: *Deleted

## 2018-05-13 ENCOUNTER — Telehealth: Payer: Self-pay

## 2018-05-13 DIAGNOSIS — R197 Diarrhea, unspecified: Secondary | ICD-10-CM

## 2018-05-13 NOTE — Telephone Encounter (Signed)
Received transfer call from [name] following him receiving instructions for biopsy. He reports he is still having loose stools and only has what sound like 3 imodium tablets left. Asking for further medication. He has no money or transportation. No stools studies noted in ED. He also has no noted ammonia level and he has heavy history of alcohol use and liver mets. He cannot remember hardly anything he is told and states his mind has not been right since surgery. We have arranged for him to come to [facility] for full work up. The cancer center [facility] will pick him up at the homeless shelter at 0900 7/19 and bring to the cancer center. He voiced understanding. We will also review his biopsy instructions. He states the shelter does allow him to stay inside during the day except during lunch hours. Oncology Nurse Navigator Documentation  Navigator Location: [facility] (05/13/18 1400)   )Navigator Encounter Type: Telephone (05/13/18 1400) Telephone: Incoming Call;Outgoing Call (05/13/18 1400)                                                  Time Spent with Patient: 30 (05/13/18 1400)

## 2018-05-14 ENCOUNTER — Inpatient Hospital Stay (HOSPITAL_BASED_OUTPATIENT_CLINIC_OR_DEPARTMENT_OTHER): Payer: 59 | Admitting: Oncology

## 2018-05-14 ENCOUNTER — Inpatient Hospital Stay: Payer: 59

## 2018-05-14 VITALS — BP 155/102 | HR 74 | Temp 97.3°F | Resp 18 | Wt 163.0 lb

## 2018-05-14 DIAGNOSIS — R197 Diarrhea, unspecified: Secondary | ICD-10-CM | POA: Diagnosis not present

## 2018-05-14 DIAGNOSIS — C189 Malignant neoplasm of colon, unspecified: Secondary | ICD-10-CM

## 2018-05-14 DIAGNOSIS — Z79899 Other long term (current) drug therapy: Secondary | ICD-10-CM

## 2018-05-14 DIAGNOSIS — D649 Anemia, unspecified: Secondary | ICD-10-CM | POA: Diagnosis not present

## 2018-05-14 DIAGNOSIS — C787 Secondary malignant neoplasm of liver and intrahepatic bile duct: Secondary | ICD-10-CM

## 2018-05-14 DIAGNOSIS — K921 Melena: Secondary | ICD-10-CM

## 2018-05-14 LAB — CBC WITH DIFFERENTIAL/PLATELET
Basophils Absolute: 0.1 10*3/uL (ref 0–0.1)
Basophils Relative: 1 %
EOS ABS: 0.1 10*3/uL (ref 0–0.7)
Eosinophils Relative: 2 %
HCT: 31.7 % — ABNORMAL LOW (ref 40.0–52.0)
HEMOGLOBIN: 10.1 g/dL — AB (ref 13.0–18.0)
LYMPHS ABS: 0.3 10*3/uL — AB (ref 1.0–3.6)
Lymphocytes Relative: 6 %
MCH: 25.5 pg — AB (ref 26.0–34.0)
MCHC: 31.9 g/dL — AB (ref 32.0–36.0)
MCV: 80 fL (ref 80.0–100.0)
MONO ABS: 0.6 10*3/uL (ref 0.2–1.0)
MONOS PCT: 12 %
Neutro Abs: 4.2 10*3/uL (ref 1.4–6.5)
Neutrophils Relative %: 79 %
Platelets: 378 10*3/uL (ref 150–440)
RBC: 3.96 MIL/uL — ABNORMAL LOW (ref 4.40–5.90)
RDW: 17.6 % — ABNORMAL HIGH (ref 11.5–14.5)
WBC: 5.3 10*3/uL (ref 3.8–10.6)

## 2018-05-14 LAB — COMPREHENSIVE METABOLIC PANEL
ALT: 22 U/L (ref 0–44)
AST: 40 U/L (ref 15–41)
Albumin: 4 g/dL (ref 3.5–5.0)
Alkaline Phosphatase: 181 U/L — ABNORMAL HIGH (ref 38–126)
Anion gap: 11 (ref 5–15)
BUN: 23 mg/dL — AB (ref 6–20)
CHLORIDE: 99 mmol/L (ref 98–111)
CO2: 27 mmol/L (ref 22–32)
CREATININE: 1.14 mg/dL (ref 0.61–1.24)
Calcium: 9.6 mg/dL (ref 8.9–10.3)
GFR calc Af Amer: >60 mL/min (ref >60)
GFR calc non Af Amer: >60 mL/min (ref >60)
GLUCOSE: 110 mg/dL — AB (ref 70–99)
Potassium: 3.9 mmol/L (ref 3.5–5.1)
Sodium: 137 mmol/L (ref 135–145)
Total Bilirubin: 0.7 mg/dL (ref 0.3–1.2)
Total Protein: 7.7 g/dL (ref 6.5–8.1)

## 2018-05-14 LAB — AMMONIA: AMMONIA: 16 umol/L (ref 9–35)

## 2018-05-14 MED ORDER — HYDROCHLOROTHIAZIDE 25 MG PO TABS: 25.0000 mg | ORAL_TABLET | Freq: Every day | ORAL | 1 refills | Status: DC

## 2018-05-14 MED ORDER — OXYCODONE-ACETAMINOPHEN 5-325 MG PO TABS: 1.0000 | ORAL_TABLET | ORAL | 0 refills | Status: DC | PRN

## 2018-05-14 MED ORDER — HYDROCHLOROTHIAZIDE 25 MG PO TABS: 25.0000 mg | ORAL_TABLET | Freq: Every day | ORAL | 1 refills | Status: AC

## 2018-05-14 MED ORDER — DIPHENOXYLATE-ATROPINE 2.5-0.025 MG PO TABS: 1.0000 | ORAL_TABLET | Freq: Four times a day (QID) | ORAL | 0 refills | Status: AC | PRN

## 2018-05-14 NOTE — Progress Notes (Signed)
Reviewed biopsy instructions again with Manuel Williamson. Provided a written copy for him. He does not read well. Oncology Nurse Navigator Documentation  Navigator Location: [facility] (05/14/18 1100)   )Navigator Encounter Type: Follow-up Appt (05/14/18 1100)                                                    Time Spent with Patient: 15 (05/14/18 1100)

## 2018-05-14 NOTE — Patient Instructions (Addendum)
The [facility] will pick you up on Thursday, 7/25, at 8:45am for your liver biopsy. They will take you to the medical mall entrance. You will need to check in at the first desk on the right, admitting/registration desk. DO NOT eat or drink anything after midnight, Wednesday, 7/24.

## 2018-05-14 NOTE — Progress Notes (Signed)
Symptom Management Consult note [facility]  Telephone:([phone] Fax:([phone]  Patient Care Team: Patient, No Pcp Per as PCP - General (General Practice) Clent Jacks, RN as Registered Nurse   Name of the patient: Manuel Williamson  [id number]  [birth year]   Date of visit: 05/17/18  Diagnosis- Colon Cancer with mets to Liver  Chief complaint/ Reason for visit- loose stools  Heme/Onc history: Patient was last seen by primary medical oncologist Dr. Grayland Ormond on 05/16/2018 where he was feeling pretty well and denied any further abdominal pain.  Has ultrasound-guided biopsy of liver scheduled for 05/20/2018.   Oncology History   Patient is a 52 year old male who presented to the emergency room with abdominal pain.  Subsequent work-up included CT scan which revealed widespread liver metastasis.  Patient has a history of colon cancer with colon resection, but does not report ever receiving chemotherapy.  He is a poor historian and we are trying to obtain records from outside facility.  He reports a recent colostomy reversal in January 2019.  He states he never received chemotherapy for his malignancy.  CT scan results from May 07, 2018 reviewed indicating  large confluent liver masses likely due to metastatic disease.  Patient CA-19-9 and CEA are both elevated.  Patient will require ultrasound-guided biopsy of the liver to confirm the diagnosis.  We are also trying to obtain his records from outside facility         Colon cancer [facility])   05/16/2018 Initial Diagnosis    Colon cancer (HCC)        Interval history- Blood in Stool:  Manuel Williamson presents for presents evaluation of blood in stool/ rectal bleeding.  He has associated symptoms of abdominal pain, constipation and diarrhea.  The patient denies constipation.   The patient has a known history of: history of malignant neoplasm of lower GI tract.  The patient has had 1 or 2 episodes of  rectal bleeding.   There is not a history of rectal injury.  Patient has had similar episodes of rectal bleeding in the past.  ECOG FS:1 - Symptomatic but completely ambulatory  Review of systems- Review of Systems  Constitutional: Positive for malaise/fatigue. Negative for chills, fever and weight loss.  HENT: Negative for congestion and ear pain.   Eyes: Negative.  Negative for blurred vision and double vision.  Respiratory: Negative.  Negative for cough, sputum production and shortness of breath.   Cardiovascular: Negative.  Negative for chest pain, palpitations and leg swelling.  Gastrointestinal: Positive for abdominal pain (RLQ), blood in stool and diarrhea. Negative for constipation, nausea and vomiting.  Genitourinary: Negative for dysuria, frequency and urgency.  Musculoskeletal: Negative for back pain and falls.  Skin: Negative.  Negative for rash.  Neurological: Positive for weakness. Negative for headaches.  Endo/Heme/Allergies: Negative.  Does not bruise/bleed easily.  Psychiatric/Behavioral: Negative.  Negative for depression. The patient is not nervous/anxious and does not have insomnia.      Current treatment- No current tx. Awaiting liver biopsy scheduled for 05/20/2018 before beginning treatment.  No Known Allergies   Past Medical History:  Diagnosis Date  . Anxiety   . Cataract   . Depression   . Diabetes mellitus without complication ([place])   . GERD (gastroesophageal reflux disease)   . Hypertension      Past Surgical History:  Procedure Laterality Date  . BOWEL RESECTION  10/2017    Social History   Socioeconomic History  . Marital status:  Married    Spouse name: Not on file  . Number of children: Not on file  . Years of education: Not on file  . Highest education level: Not on file  Occupational History  . Not on file  Social Needs  . Financial resource strain: Not on file  . Food insecurity:    Worry: Not on file    Inability: Not on file    . Transportation needs:    Medical: Not on file    Non-medical: Not on file  Tobacco Use  . Smoking status: Never Smoker  . Smokeless tobacco: Never Used  Substance and Sexual Activity  . Alcohol use: No    Comment: former drinking 3-4 40 oz /day. last use 1 mo ago per pt  . 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 Sexual activity: Not on file  Lifestyle  . Physical activity:    Days per week: Not on file    Minutes per session: Not on file  . Stress: Not on file  Relationships  . Social connections:    Talks on phone: Not on file    Gets together: Not on file    Attends religious service: Not on file    Active member of club or organization: Not on file    Attends meetings of clubs or organizations: Not on file    Relationship status: Not on file  . Intimate partner violence:    Fear of current or ex partner: Not on file    Emotionally abused: Not on file    Physically abused: Not on file    Forced sexual activity: Not on file  Other Topics Concern  . Not on file  Social History Narrative  . Not on file    No family history on file.   Current Outpatient Medications:  .  diphenoxylate-atropine (LOMOTIL) 2.5-0.025 MG tablet, Take 1 tablet by mouth 4 (four) times daily as needed for diarrhea or loose stools., Disp: 30 tablet, Rfl: 0 .  oxyCODONE-acetaminophen (PERCOCET) 5-325 MG tablet, Take 1 tablet by mouth every 4 (four) hours as needed for severe pain., Disp: 20 tablet, Rfl: 0 .  hydrochlorothiazide (HYDRODIURIL) 25 MG tablet, Take 1 tablet (25 mg total) by mouth daily., Disp: 30 tablet, Rfl: 1 .  loperamide (IMODIUM A-D) 2 MG tablet, Take 1 tablet (2 mg total) by mouth 4 (four) times daily as needed for diarrhea or loose stools. (Patient not taking: Reported on 05/14/2018), Disp: 30 tablet, Rfl: 0  Physical exam:  Vitals:   05/14/18 1007  BP: (!) 155/102  Pulse: 74  Resp: 18  Temp: (!) 97.3 F (36.3  C)  Weight: 163 lb (73.9 kg)   Physical Exam  Constitutional: He is oriented to person, place, and time. Vital signs are normal. He appears well-developed and well-nourished.  HENT:  Head: Normocephalic and atraumatic.  Eyes: Pupils are equal, round, and reactive to light.  Neck: Normal range of motion.  Cardiovascular: Normal rate, regular rhythm and normal heart sounds.  No murmur heard. Pulmonary/Chest: Effort normal and breath sounds normal. He has no wheezes.  Abdominal: Soft. Normal appearance and bowel sounds are normal. He exhibits no distension. There is hepatomegaly. There is tenderness in the right lower quadrant.    Musculoskeletal: Normal range of motion. He exhibits no edema.  Neurological: He is alert and oriented to person, place, and time.  Skin: Skin  is warm and dry. No rash noted.  Psychiatric: Judgment normal.     CMP Latest Ref Rng & Units 05/14/2018  Glucose 70 - 99 mg/dL 110(H)  BUN 6 - 20 mg/dL 23(H)  Creatinine 0.61 - 1.24 mg/dL 1.14  Sodium 135 - 145 mmol/L 137  Potassium 3.5 - 5.1 mmol/L 3.9  Chloride 98 - 111 mmol/L 99  CO2 22 - 32 mmol/L 27  Calcium 8.9 - 10.3 mg/dL 9.6  Total Protein 6.5 - 8.1 g/dL 7.7  Total Bilirubin 0.3 - 1.2 mg/dL 0.7  Alkaline Phos 38 - 126 U/L 181(H)  AST 15 - 41 U/L 40  ALT 0 - 44 U/L 22   CBC Latest Ref Rng & Units 05/14/2018  WBC 3.8 - 10.6 K/uL 5.3  Hemoglobin 13.0 - 18.0 g/dL 10.1(L)  Hematocrit 40.0 - 52.0 % 31.7(L)  Platelets 150 - 440 K/uL 378    No images are attached to the encounter.  Ct Abdomen Pelvis W Contrast  Result Date: 05/07/2018 CLINICAL DATA:  Nausea, vomiting and diarrhea. Patient diagnosed with colon cancer in January, 2019. Status post colon resection. EXAM: CT ABDOMEN AND PELVIS WITH CONTRAST TECHNIQUE: Multidetector CT imaging of the abdomen and pelvis was performed using the standard protocol following bolus administration of intravenous contrast. CONTRAST:  100 ml OMNIPAQUE IOHEXOL 300 MG/ML   SOLN COMPARISON:  None. FINDINGS: Lower chest: Heart size is upper normal. No pleural or pericardial effusion. Mild dependent atelectasis is seen in the lung bases. Hepatobiliary: Multiple large confluent masses are identified in the liver. For example, lesion in the posterior right hepatic lobe measures 10 cm transverse by 11 cm AP on image 18. The liver is enlarged by these masses measuring 24 cm craniocaudal and displacing the pancreas to the left and right kidney inferiorly. The gallbladder and biliary tree are unremarkable. Pancreas: Unremarkable. No pancreatic ductal dilatation or surrounding inflammatory changes. Spleen: Normal in size without focal abnormality. Adrenals/Urinary Tract: Nonobstructing stone midpole left kidney measures 0.5 cm in diameter. The kidneys are otherwise unremarkable. Wall thickening is seen in almost completely decompressed urinary bladder. The adrenal glands are unremarkable. Stomach/Bowel: Surgical anastomosis near the rectosigmoid colon is consistent with prior colon resection. There also appears to be a small bowel anastomosis in the pelvis. No bowel obstruction or inflammatory change. The stomach is unremarkable. The appendix is normal. Vascular/Lymphatic: Scattered atherosclerotic calcifications are seen. No lymphadenopathy. Reproductive: Prostate is unremarkable. Other: No intra-abdominal fluid collection or free air. Musculoskeletal: No lytic or sclerotic lesion is identified. Severe bilateral hip osteoarthritis is advanced for age. Degenerative disc disease L1-2 and L4-5 is also identified. IMPRESSION: Multiple large confluent liver masses most likely due to metastatic disease in this patient with a history of colon cancer. The masses result in hepatomegaly and inferior displacement of the right kidney and leftward displacement of the pancreas. Postoperative change of the colon without evidence of complication. Nonobstructing stone midpole left kidney. Wall thickening of  the urinary bladder is likely related to underdistention rather than cystitis. 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 Electronically Signed   By: Inge Rise M.D.   On: 05/07/2018 13:30     Assessment and plan- Patient is a 52 y.o. male who presents for persistent diarrhea and blood in his stool.  1.  Stage IV colon cancer: Currently not on therapy.  Awaiting liver biopsy on 05/20/2018.  Previous history of colon cancer with bowel resection s/p recent colostomy reversal in January 2019.  Reports several episodes of diarrhea  for which she takes lomotil.  He is scheduled to return to clinic on 06/02/2018 for biopsy results and treatment planning with Dr. Grayland Ormond.  2.  Bloody stools: States they are intermittent and do not occur every day.  Admits to occasional bloody stools in the past.  Has history of known colon cancer.  Bloody stools Likely related to previous cancer diagnosis.  Will check blood counts.  Awaiting biopsy results next week for confirmation of recurrence of colon cancer.   Tumor markers elevated. CEA 81.2.   Hemoglobin holding steady at 10.1.  Appears to be his baseline from previous hemoglobin of 10.2 on 05/07/2018.  We will continue to monitor.   Will refill Lomotil using [facility].  3.  Abdominal pain: Patient previously prescribed Percocet 5-325 mg q 4 hours PRN unfortunately was unable to pick this up d/t financial concerns.  Spoke with Roderic Ovens, nurse navigator and we were able to fill patient's medications using the [facility].   4.  Hypertension: Re-sent prescription for hydrochlorothiazide 25 mg to pharmacy under [facility].  BP today is 155/102.  We will continue to monitor.  Patient instructed to take medication when received today.    Visit Diagnosis 1. Malignant neoplasm of colon, unspecified part of colon ([place])   2. Bloody stools     Patient expressed understanding and was in agreement with this plan. He also understands that  He can call clinic at any time with any questions, concerns, or complaints.   Greater than 50% was spent in counseling and coordination of care with this patient including but not limited to discussion of the relevant topics above (See A&P) including, but not limited to diagnosis and management of acute and chronic medical conditions.    Faythe Casa, AGNP-C [place] [facility] Office: [phone] Pager- [phone] 05/17/2018 8:01 AM

## 2018-05-16 DIAGNOSIS — C189 Malignant neoplasm of colon, unspecified: Secondary | ICD-10-CM | POA: Insufficient documentation

## 2018-05-16 DIAGNOSIS — Z7189 Other specified counseling: Secondary | ICD-10-CM | POA: Insufficient documentation

## 2018-05-16 NOTE — Progress Notes (Signed)
[facility]  Telephone:([phone] Fax:(336) [phone]  ID: Arlyce Harman OB: 20-Nov-1965  MR#: [id number]  XMI#:[id number]  Patient Care Team: Patient, No Pcp Per as PCP - General (General Practice) Clent Jacks, RN as Registered Nurse  CHIEF COMPLAINT: Widespread liver metastasis.  INTERVAL HISTORY: Patient is a 52 year old male who presented to the emergency room with abdominal pain.  Subsequent work-up included CT scan which revealed widespread liver metastasis.  Patient has a history of colon cancer with colon resection, but does not report ever receiving chemotherapy.  He is a poor historian and we are trying to obtain records from outside facility.  He currently feels well.  He does not complain of any further abdominal pain today.  He has no neurologic complaints.  He denies any recent fevers.  He has a fair appetite, but denies weight loss.  He has no chest pain or shortness of breath.  He denies any nausea, vomiting, constipation, or diarrhea.  He has no melena or hematochezia.  He has no urinary complaints.  Patient otherwise feels well and offers no further specific complaints.  REVIEW OF SYSTEMS:   Review of Systems  Constitutional: Negative.  Negative for fever, malaise/fatigue and weight loss.  Respiratory: Negative.  Negative for cough, hemoptysis and shortness of breath.   Cardiovascular: Negative.  Negative for chest pain and leg swelling.  Gastrointestinal: Negative.  Negative for abdominal pain, blood in stool and melena.  Genitourinary: Negative.  Negative for dysuria.  Musculoskeletal: Negative.  Negative for back pain.  Skin: Negative.  Negative for rash.  Neurological: Negative.  Negative for sensory change, focal weakness, weakness and headaches.  Psychiatric/Behavioral: Negative.  The patient is not nervous/anxious.     As per HPI. Otherwise, a complete review of systems is negative.  PAST MEDICAL HISTORY: Past Medical History:    Diagnosis Date  . Anxiety   . Cataract   . Depression   . Diabetes mellitus without complication ([place])   . GERD (gastroesophageal reflux disease)   . Hypertension     PAST SURGICAL HISTORY: Past Surgical History:  Procedure Laterality Date  . BOWEL RESECTION  10/2017    FAMILY HISTORY: No family history on file.  ADVANCED DIRECTIVES (Y/N):  N  HEALTH MAINTENANCE: Social History   Tobacco Use  . Smoking status: Never Smoker  . Smokeless tobacco: Never Used  Substance Use Topics  . Alcohol use: No    Comment: former drinking 3-4 40 oz /day. last use 1 mo ago per pt  . 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 diphenoxylate-atropine (LOMOTIL) 2.5-0.025 MG tablet Take 1 tablet by mouth 4 (four) times daily as needed for diarrhea or loose stools. 30 tablet 0  . hydrochlorothiazide (HYDRODIURIL) 25 MG tablet Take 1 tablet (25 mg total) by mouth daily. 30 tablet 1  . loperamide (IMODIUM A-D) 2 MG tablet Take 1 tablet (2 mg total) by mouth 4 (four) times daily as needed for diarrhea or loose stools. (Patient not taking: Reported on 05/14/2018) 30 tablet 0  . oxyCODONE-acetaminophen (PERCOCET) 5-325 MG tablet Take 1 tablet by mouth every 4 (four) hours as needed for severe pain. 20 tablet 0   No current facility-administered medications for this visit.  OBJECTIVE: Vitals:   05/11/18 1409  BP: 137/88  Pulse: 78  Resp: 18  Temp: (!) 95.6 F (35.3 C)     Body mass index is 26.79 kg/m.    ECOG FS:0 - Asymptomatic  General: Well-developed, well-nourished, no acute distress. Eyes: Pink conjunctiva, anicteric sclera. HEENT: Normocephalic, moist mucous membranes, clear oropharnyx. Lungs: Clear to auscultation bilaterally. Heart: Regular rate and rhythm. No rubs,  murmurs, or gallops. Abdomen: Soft, nontender, nondistended.  Easily palpable massively enlarged liver. Musculoskeletal: No edema, cyanosis, or clubbing. Neuro: Alert, answering all questions appropriately. Cranial nerves grossly intact. Skin: No rashes or petechiae noted. Psych: Normal affect. Lymphatics: No cervical, calvicular, axillary or inguinal LAD.   LAB RESULTS:  Lab Results  Component Value Date   NA 137 05/14/2018   K 3.9 05/14/2018   CL 99 05/14/2018   CO2 27 05/14/2018   GLUCOSE 110 (H) 05/14/2018   BUN 23 (H) 05/14/2018   CREATININE 1.14 05/14/2018   CALCIUM 9.6 05/14/2018   PROT 7.7 05/14/2018   ALBUMIN 4.0 05/14/2018   AST 40 05/14/2018   ALT 22 05/14/2018   ALKPHOS 181 (H) 05/14/2018   BILITOT 0.7 05/14/2018   GFRNONAA >60 05/14/2018   GFRAA >60 05/14/2018    Lab Results  Component Value Date   WBC 5.3 05/14/2018   NEUTROABS 4.2 05/14/2018   HGB 10.1 (L) 05/14/2018   HCT 31.7 (L) 05/14/2018   MCV 80.0 05/14/2018   PLT 378 05/14/2018     STUDIES: Ct Abdomen Pelvis W Contrast  Result Date: 05/07/2018 CLINICAL DATA:  Nausea, vomiting and diarrhea. Patient diagnosed with colon cancer in January, 2019. Status post colon resection. EXAM: CT ABDOMEN AND PELVIS WITH CONTRAST TECHNIQUE: Multidetector CT imaging of the abdomen and pelvis was performed using the standard protocol following bolus administration of intravenous contrast. CONTRAST:  100 ml OMNIPAQUE IOHEXOL 300 MG/ML  SOLN COMPARISON:  None. FINDINGS: Lower chest: Heart size is upper normal. No pleural or pericardial effusion. Mild dependent atelectasis is seen in the lung bases. Hepatobiliary: Multiple large confluent masses are identified in the liver. For example, lesion in the posterior right hepatic lobe measures 10 cm transverse by 11 cm AP on image 18. The liver is enlarged by these masses measuring 24 cm craniocaudal and displacing the pancreas to the left and right kidney inferiorly. The  gallbladder and biliary tree are unremarkable. Pancreas: Unremarkable. No pancreatic ductal dilatation or surrounding inflammatory changes. Spleen: Normal in size without focal abnormality. Adrenals/Urinary Tract: Nonobstructing stone midpole left kidney measures 0.5 cm in diameter. The kidneys are otherwise unremarkable. Wall thickening is seen in almost completely decompressed urinary bladder. The adrenal glands are unremarkable. Stomach/Bowel: Surgical anastomosis near the rectosigmoid colon is consistent with prior colon resection. There also appears to be a small bowel anastomosis in the pelvis. No bowel obstruction or inflammatory change. The stomach is unremarkable. The appendix is normal. Vascular/Lymphatic: Scattered atherosclerotic calcifications are seen. No lymphadenopathy. Reproductive: Prostate is unremarkable. Other: No intra-abdominal fluid collection or free air. Musculoskeletal: No lytic or sclerotic lesion is identified. Severe bilateral hip osteoarthritis is advanced for age. Degenerative disc disease L1-2 and L4-5 is also identified. IMPRESSION: Multiple large confluent liver masses most likely due to metastatic disease in this patient with a history of colon cancer. The masses result in hepatomegaly and inferior displacement of the right kidney and leftward displacement of the pancreas. Postoperative change of the colon without evidence of complication. Nonobstructing stone midpole left kidney. Wall thickening of the urinary bladder  is likely related to underdistention rather than cystitis. 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 Electronically Signed   By: Inge Rise M.D.   On: 05/07/2018 13:30    ASSESSMENT: Widespread liver metastasis.  PLAN:    1. Widespread liver metastasis: Patient has a recent history of colon cancer, but staging is unclear.  He reports a recent colostomy reversal in January 2019.  He states he never received chemotherapy for his  malignancy.  CT scan results from May 07, 2018 reviewed independently and report as above with large confluent liver masses likely due to metastatic disease.  Patient CA-19-9 and CEA are both elevated.  Patient will require ultrasound-guided biopsy of the liver to confirm the diagnosis.  We are also trying to obtain his records from outside facility.  Return to clinic 1 week after his biopsy to discuss the results and treatment planning. 2.  Anemia: Patient's hemoglobin is 10.1, monitor.  I spent a total of 60 minutes face-to-face with the patient of which greater than 50% of the visit was spent in counseling and coordination of care as detailed above.   Patient expressed understanding and was in agreement with this plan. He also understands that He can call clinic at any time with any questions, concerns, or complaints.   Cancer Staging No matching staging information was found for the patient.  Lloyd Huger, MD   05/16/2018 8:58 AM

## 2018-05-19 ENCOUNTER — Other Ambulatory Visit: Payer: Self-pay | Admitting: Radiology

## 2018-05-20 ENCOUNTER — Ambulatory Visit
Admission: RE | Admit: 2018-05-20 | Discharge: 2018-05-20 | Disposition: A | Discharge status: Home / Self Care | Payer: 59 | Source: Ambulatory Visit | Attending: Oncology | Admitting: Oncology

## 2018-05-20 ENCOUNTER — Ambulatory Visit: Payer: Medicaid Other

## 2018-05-20 DIAGNOSIS — Z79899 Other long term (current) drug therapy: Secondary | ICD-10-CM | POA: Diagnosis not present

## 2018-05-20 DIAGNOSIS — R160 Hepatomegaly, not elsewhere classified: Secondary | ICD-10-CM | POA: Insufficient documentation

## 2018-05-20 DIAGNOSIS — K219 Gastro-esophageal reflux disease without esophagitis: Secondary | ICD-10-CM | POA: Insufficient documentation

## 2018-05-20 DIAGNOSIS — C787 Secondary malignant neoplasm of liver and intrahepatic bile duct: Secondary | ICD-10-CM | POA: Diagnosis not present

## 2018-05-20 DIAGNOSIS — C189 Malignant neoplasm of colon, unspecified: Secondary | ICD-10-CM | POA: Diagnosis not present

## 2018-05-20 DIAGNOSIS — E119 Type 2 diabetes mellitus without complications: Secondary | ICD-10-CM | POA: Insufficient documentation

## 2018-05-20 DIAGNOSIS — C19 Malignant neoplasm of rectosigmoid junction: Secondary | ICD-10-CM | POA: Diagnosis not present

## 2018-05-20 DIAGNOSIS — Z7984 Long term (current) use of oral hypoglycemic drugs: Secondary | ICD-10-CM | POA: Insufficient documentation

## 2018-05-20 DIAGNOSIS — F419 Anxiety disorder, unspecified: Secondary | ICD-10-CM | POA: Diagnosis not present

## 2018-05-20 DIAGNOSIS — I10 Essential (primary) hypertension: Secondary | ICD-10-CM | POA: Insufficient documentation

## 2018-05-20 DIAGNOSIS — F329 Major depressive disorder, single episode, unspecified: Secondary | ICD-10-CM | POA: Diagnosis not present

## 2018-05-20 LAB — GLUCOSE, CAPILLARY
Glucose-Capillary: 55 mg/dL — ABNORMAL LOW (ref 70–99)
Glucose-Capillary: 62 mg/dL — ABNORMAL LOW (ref 70–99)
Glucose-Capillary: 77 mg/dL (ref 70–99)

## 2018-05-20 LAB — PROTIME-INR
INR: 1.24
Prothrombin Time: 15.5 seconds — ABNORMAL HIGH (ref 11.4–15.2)

## 2018-05-20 LAB — CBC
HCT: 33.2 % — ABNORMAL LOW (ref 40.0–52.0)
Hemoglobin: 10.9 g/dL — ABNORMAL LOW (ref 13.0–18.0)
MCH: 26 pg (ref 26.0–34.0)
MCHC: 32.9 g/dL (ref 32.0–36.0)
MCV: 79 fL — AB (ref 80.0–100.0)
PLATELETS: 359 10*3/uL (ref 150–440)
RBC: 4.2 MIL/uL — ABNORMAL LOW (ref 4.40–5.90)
RDW: 17.2 % — AB (ref 11.5–14.5)
WBC: 5.6 10*3/uL (ref 3.8–10.6)

## 2018-05-20 MED ORDER — SODIUM CHLORIDE 0.9 % IV SOLN: INTRAVENOUS | Status: DC

## 2018-05-20 MED ORDER — DEXTROSE 5 % IV SOLN
INTRAVENOUS | Status: DC
  Administered 2018-05-20: 10:00:00 via INTRAVENOUS

## 2018-05-20 MED ORDER — MIDAZOLAM HCL 5 MG/5ML IJ SOLN
INTRAMUSCULAR | Status: AC | PRN
  Administered 2018-05-20: 1 mg via INTRAVENOUS

## 2018-05-20 MED ORDER — FENTANYL CITRATE (PF) 100 MCG/2ML IJ SOLN
INTRAMUSCULAR | Status: AC | PRN
  Administered 2018-05-20: 50 ug via INTRAVENOUS

## 2018-05-20 MED ORDER — FENTANYL CITRATE (PF) 100 MCG/2ML IJ SOLN
INTRAMUSCULAR | Status: AC
  Filled 2018-05-20: qty 4

## 2018-05-20 MED ORDER — MIDAZOLAM HCL 5 MG/5ML IJ SOLN
INTRAMUSCULAR | Status: AC
  Filled 2018-05-20: qty 5

## 2018-05-20 NOTE — Procedures (Signed)
Interventional Radiology Procedure Note  Procedure: US guided biopsy of liver lesion   Complications: None  Estimated Blood Loss: < 10 mL  Findings: Large mass in right lobe sampled via 17 G needle with 18 G core biopsy x 4.  Gelfoam pledgets advanced as outer needle removed.  Venetia Night. Kathlene Cote, M.D Pager:  [phone]

## 2018-05-20 NOTE — Progress Notes (Signed)
Pt. Unable to recall who brought him to the hospital today. States "my memory is shot since I've been staying [facility] the homeless shelter." [employer], [place] and [facility]: ALL state they did not bring him. Called and left message at [facility]. Pt. States "yeah, they brought me" when mentioned [place] [facility] bus to him. Pt. States he is at the [facility] on [address] Also spoke with Care Manager Armida Sans.

## 2018-05-20 NOTE — H&P (Signed)
Chief Complaint: Patient was seen in consultation today for liver biopsy at the request of Finnegan,Timothy J  Referring Physician(s): Finnegan,Timothy J  Patient Status: [facility] - Out-pt  History of Present Illness: Manuel Williamson is a 52 y.o. male with a history of colon carcinoma presenting with CT evidence of advanced metastatic disease throughout liver. Did present with abdominal pain, which is improved after receiving pain medication. No nausea, vomiting, jaundice, melena, hematochezia. Presents now for US guided biopsy of one of the liver lesions to establish metastatic disease and analyze tissue prior to potential systemic treatment.  Past Medical History:  Diagnosis Date  . Anxiety   . Cataract   . Depression   . Diabetes mellitus without complication ([place])   . GERD (gastroesophageal reflux disease)   . Hypertension     Past Surgical History:  Procedure Laterality Date  . BOWEL RESECTION  10/2017    Allergies: Patient has no known allergies.  Medications: Prior to Admission medications   Medication Sig Start Date End Date Taking? Authorizing Provider  hydrochlorothiazide (HYDRODIURIL) 25 MG tablet Take 1 tablet (25 mg total) by mouth daily. 05/14/18  Yes Jacquelin Hawking, NP  loperamide (IMODIUM A-D) 2 MG tablet Take 1 tablet (2 mg total) by mouth 4 (four) times daily as needed for diarrhea or loose stools. 05/07/18  Yes Alfred Levins, [place], MD  metFORMIN (GLUCOPHAGE) 1000 MG tablet Take 500 mg by mouth 2 (two) times daily with a meal.   Yes [provider]  oxyCODONE-acetaminophen (PERCOCET) 5-325 MG tablet Take 1 tablet by mouth every 4 (four) hours as needed for severe pain. 05/14/18  Yes Burns, Wandra Feinstein, NP  diphenoxylate-atropine (LOMOTIL) 2.5-0.025 MG tablet Take 1 tablet by mouth 4 (four) times daily as needed for diarrhea or loose stools. Patient not taking: Reported on 05/20/2018 05/14/18   Jacquelin Hawking, NP     No family history on  file.  Social History   Socioeconomic History  . Marital status: Married    Spouse name: Not on file  . Number of children: Not on file  . Years of education: Not on file  . Highest education level: Not on file  Occupational History  . Not on file  Social Needs  . Financial resource strain: Not on file  . Food insecurity:    Worry: Not on file    Inability: Not on file  . Transportation needs:    Medical: Not on file    Non-medical: Not on file  Tobacco Use  . Smoking status: Never Smoker  . Smokeless tobacco: Never Used  Substance and Sexual Activity  . Alcohol use: No    Comment: former drinking 3-4 40 oz /day. last use 1 mo ago per pt  . 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 Sexual activity: Not on file  Lifestyle  . Physical activity:    Days per week: Not on file    Minutes per session: Not on file  . Stress: Not on file  Relationships  . Social connections:    Talks on phone: Not on file    Gets together: Not on file    Attends religious service: Not on file    Active member of club or organization: Not on file    Attends meetings of clubs or organizations: Not on file    Relationship status: Not on file  Other Topics Concern  .  Not on file  Social History Narrative  . Not on file    ECOG Status: 1 - Symptomatic but completely ambulatory  Review of Systems: A 12 point ROS discussed and pertinent positives are indicated in the HPI above.  All other systems are negative.  Review of Systems  Constitutional: Negative.   HENT: Negative.   Respiratory: Negative.   Cardiovascular: Negative.   Gastrointestinal: Positive for abdominal pain. Negative for anal bleeding, blood in stool, constipation, diarrhea, nausea, rectal pain and vomiting.  Genitourinary: Negative.   Musculoskeletal: Negative.   Neurological: Negative.     Vital Signs: BP (!) 146/95   Pulse 70   Temp 98 F (36.7 C)  (Oral)   Resp 18   Ht 5' 5.75" (1.67 m)   Wt 163 lb (73.9 kg)   SpO2 100%   BMI 26.51 kg/m   Physical Exam  Constitutional: He is oriented to person, place, and time. No distress.  HENT:  Head: Normocephalic and atraumatic.  Eyes: No scleral icterus.  Neck: Neck supple. No JVD present. No tracheal deviation present.  Cardiovascular: Normal rate, regular rhythm and normal heart sounds. Exam reveals no gallop and no friction rub.  No murmur heard. Pulmonary/Chest: Effort normal and breath sounds normal. No stridor. No respiratory distress. He has no wheezes. He has no rales.  Abdominal: Soft. Bowel sounds are normal. He exhibits no distension. There is no tenderness. There is no rebound and no guarding.  Musculoskeletal: He exhibits no edema.  Lymphadenopathy:    He has no cervical adenopathy.  Neurological: He is alert and oriented to person, place, and time.  Skin: Skin is warm and dry. He is not diaphoretic.  Vitals reviewed.   Imaging: Ct Abdomen Pelvis W Contrast  Result Date: 05/07/2018 CLINICAL DATA:  Nausea, vomiting and diarrhea. Patient diagnosed with colon cancer in January, 2019. Status post colon resection. EXAM: CT ABDOMEN AND PELVIS WITH CONTRAST TECHNIQUE: Multidetector CT imaging of the abdomen and pelvis was performed using the standard protocol following bolus administration of intravenous contrast. CONTRAST:  100 ml OMNIPAQUE IOHEXOL 300 MG/ML  SOLN COMPARISON:  None. FINDINGS: Lower chest: Heart size is upper normal. No pleural or pericardial effusion. Mild dependent atelectasis is seen in the lung bases. Hepatobiliary: Multiple large confluent masses are identified in the liver. For example, lesion in the posterior right hepatic lobe measures 10 cm transverse by 11 cm AP on image 18. The liver is enlarged by these masses measuring 24 cm craniocaudal and displacing the pancreas to the left and right kidney inferiorly. The gallbladder and biliary tree are unremarkable.  Pancreas: Unremarkable. No pancreatic ductal dilatation or surrounding inflammatory changes. Spleen: Normal in size without focal abnormality. Adrenals/Urinary Tract: Nonobstructing stone midpole left kidney measures 0.5 cm in diameter. The kidneys are otherwise unremarkable. Wall thickening is seen in almost completely decompressed urinary bladder. The adrenal glands are unremarkable. Stomach/Bowel: Surgical anastomosis near the rectosigmoid colon is consistent with prior colon resection. There also appears to be a small bowel anastomosis in the pelvis. No bowel obstruction or inflammatory change. The stomach is unremarkable. The appendix is normal. Vascular/Lymphatic: Scattered atherosclerotic calcifications are seen. No lymphadenopathy. Reproductive: Prostate is unremarkable. Other: No intra-abdominal fluid collection or free air. Musculoskeletal: No lytic or sclerotic lesion is identified. Severe bilateral hip osteoarthritis is advanced for age. Degenerative disc disease L1-2 and L4-5 is also identified. IMPRESSION: Multiple large confluent liver masses most likely due to metastatic disease in this patient with a history of colon cancer.  The masses result in hepatomegaly and inferior displacement of the right kidney and leftward displacement of the pancreas. Postoperative change of the colon without evidence of complication. Nonobstructing stone midpole left kidney. Wall thickening of the urinary bladder is likely related to underdistention rather than cystitis. 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 Electronically Signed   By: Inge Rise M.D.   On: 05/07/2018 13:30    Labs:  CBC: Recent Labs    05/07/18 1134 05/14/18 0939 05/20/18 0941  WBC 5.2 5.3 5.6  HGB 10.2* 10.1* 10.9*  HCT 31.4* 31.7* 33.2*  PLT 423 378 359    COAGS: Recent Labs    05/11/18 1607 05/20/18 0941  INR 1.21 1.24  APTT 31  --     BMP: Recent Labs    05/07/18 1134 05/14/18 0939   NA 137 137  K 4.0 3.9  CL 102 99  CO2 25 27  GLUCOSE 80 110*  BUN 14 23*  CALCIUM 9.4 9.6  CREATININE 0.91 1.14  GFRNONAA >60 >60  GFRAA >60 >60    LIVER FUNCTION TESTS: Recent Labs    05/07/18 1134 05/14/18 0939  BILITOT 0.8 0.7  AST 40 40  ALT 23 22  ALKPHOS 187* 181*  PROT 7.5 7.7  ALBUMIN 3.7 4.0    TUMOR MARKERS: CEA 81.2 CA 19-9 373  Assessment and Plan:  For ultrasound guided biopsy of one of the probable metastatic lesions in liver today. Risks and benefits discussed with the patient including, but not limited to bleeding, infection, damage to adjacent structures or low yield requiring additional tests. All of the patient's questions were answered, patient is agreeable to proceed. Consent signed and in chart.  Thank you for this interesting consult.  I greatly enjoyed meeting Manuel Williamson and look forward to participating in their care.  A copy of this report was sent to the requesting provider on this date.  Electronically Signed: Azzie Roup, MD 05/20/2018, 10:47 AM     I spent a total of 30 Minutes  in face to face in clinical consultation, greater than 50% of which was counseling/coordinating care for liver lesion biopsy.

## 2018-05-20 NOTE — Progress Notes (Signed)
Spoke with Hailey with [facility] to pick up pt. At 1:30 PM to take pt. Back to shelter.

## 2018-05-21 LAB — SURGICAL PATHOLOGY

## 2018-05-24 ENCOUNTER — Telehealth: Payer: Self-pay

## 2018-05-24 NOTE — Telephone Encounter (Signed)
Attempted to call Mr. Hymas. Phone number listed for him incorrect per person answering phone. They stated they have had that phone for several months. Voicemail left with number listed for son to return call. Need to reattempt location of previous surgery for colon cancer and obtain records. Oncology Nurse Navigator Documentation  Navigator Location: [facility] (05/24/18 1000)   )Navigator Encounter Type: Telephone (05/24/18 1000) Telephone: Outgoing Call (05/24/18 1000)                                                  Time Spent with Patient: 15 (05/24/18 1000)

## 2018-05-31 NOTE — Progress Notes (Signed)
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  INTERVAL HISTORY: Patient returns to clinic today for further evaluation and treatment planning.  He has increased weakness and fatigue.  He continues to have abdominal pain and a poor appetite.  He has increased nausea.  He has no neurologic complaints.  He denies any recent fevers.  He has no chest pain or shortness of breath.  He denies any vomiting, constipation, or diarrhea.  He has no melena or hematochezia.  He has no urinary complaints.  Patient offers no further specific complaints today.  REVIEW OF SYSTEMS:   Review of Systems  Constitutional: Positive for malaise/fatigue. Negative for fever and weight loss.  Respiratory: Negative.  Negative for cough, hemoptysis and shortness of breath.   Cardiovascular: Negative.  Negative for chest pain and leg swelling.  Gastrointestinal: Positive for nausea. Negative for abdominal pain, blood in stool and melena.  Genitourinary: Negative.  Negative for dysuria.  Musculoskeletal: Negative.  Negative for back pain.  Skin: Negative.  Negative for rash.  Neurological: Positive for weakness. Negative for sensory change, focal weakness and headaches.  Psychiatric/Behavioral: Negative.  The patient is not nervous/anxious.     As per HPI. Otherwise, a complete review of systems is negative.  PAST MEDICAL HISTORY: Past Medical History:  Diagnosis Date  . Anxiety   . Cataract   . Depression   . Diabetes mellitus without complication ([place])   . GERD (gastroesophageal reflux disease)   . Hypertension     PAST SURGICAL HISTORY: Past Surgical History:  Procedure Laterality Date  . BOWEL RESECTION  10/2017    FAMILY  HISTORY: History reviewed. No pertinent family history.  ADVANCED DIRECTIVES (Y/N):  N  HEALTH MAINTENANCE: Social History   Tobacco Use  . Smoking status: Never Smoker  . Smokeless tobacco: Never Used  Substance Use Topics  . Alcohol use: No    Comment: former drinking 3-4 40 oz /day. last use 1 mo ago per pt  . 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 hydrochlorothiazide (HYDRODIURIL) 25 MG tablet Take 1 tablet (25 mg total) by mouth daily. 30 tablet 1  . diphenoxylate-atropine (LOMOTIL) 2.5-0.025 MG tablet Take 1 tablet by mouth 4 (four) times daily as needed for diarrhea or loose stools. (Patient not taking: Reported on 05/20/2018) 30 tablet 0  . lidocaine-prilocaine (EMLA) cream Apply 1 application topically as needed. Apply to port 1-2 hours prior to chemotherapy appointment. Cover with plastic wrap. 30 g 1  . loperamide (IMODIUM A-D) 2 MG tablet Take 1 tablet (2 mg total) by mouth 4 (four) times daily as needed for diarrhea or loose stools. (Patient not taking: Reported on 06/02/2018) 30 tablet 0  . metFORMIN (GLUCOPHAGE) 1000 MG tablet Take 500 mg by mouth 2 (two) times daily with a meal.    . ondansetron (ZOFRAN) 8 MG tablet Take 1 tablet (8 mg total) by mouth every 8 (eight) hours as needed for nausea or vomiting. 30 tablet 0  . oxyCODONE-acetaminophen (PERCOCET) 5-325 MG  tablet Take 1 tablet by mouth every 4 (four) hours as needed for severe pain. 30 tablet 0  . prochlorperazine (COMPAZINE) 10 MG tablet Take 1 tablet (10 mg total) by mouth every 6 (six) hours as needed for nausea or vomiting. 30 tablet 0   No current facility-administered medications for this visit.     OBJECTIVE: Vitals:   06/02/18 1044  BP: (!) 162/99  Pulse: 85  Resp: 18  Temp: (!) 96.5 F (35.8  C)     Body mass index is 27.69 kg/m.    ECOG FS:0 - Asymptomatic  General: Well-developed, well-nourished, no acute distress. Eyes: Pink conjunctiva, anicteric sclera. HEENT: Normocephalic, moist mucous membranes. Lungs: Clear to auscultation bilaterally. Heart: Regular rate and rhythm. No rubs, murmurs, or gallops. Abdomen: Soft, nontender, nondistended. No organomegaly noted, normoactive bowel sounds. Musculoskeletal: No edema, cyanosis, or clubbing. Neuro: Alert, answering all questions appropriately. Cranial nerves grossly intact. Skin: No rashes or petechiae noted. Psych: Normal affect.  LAB RESULTS:  Lab Results  Component Value Date   NA 137 05/14/2018   K 3.9 05/14/2018   CL 99 05/14/2018   CO2 27 05/14/2018   GLUCOSE 110 (H) 05/14/2018   BUN 23 (H) 05/14/2018   CREATININE 1.14 05/14/2018   CALCIUM 9.6 05/14/2018   PROT 7.7 05/14/2018   ALBUMIN 4.0 05/14/2018   AST 40 05/14/2018   ALT 22 05/14/2018   ALKPHOS 181 (H) 05/14/2018   BILITOT 0.7 05/14/2018   GFRNONAA >60 05/14/2018   GFRAA >60 05/14/2018    Lab Results  Component Value Date   WBC 5.6 05/20/2018   NEUTROABS 4.2 05/14/2018   HGB 10.9 (L) 05/20/2018   HCT 33.2 (L) 05/20/2018   MCV 79.0 (L) 05/20/2018   PLT 359 05/20/2018     STUDIES: Ct Abdomen Pelvis W Contrast  Result Date: 05/07/2018 CLINICAL DATA:  Nausea, vomiting and diarrhea. Patient diagnosed with colon cancer in January, 2019. Status post colon resection. EXAM: CT ABDOMEN AND PELVIS WITH CONTRAST TECHNIQUE: Multidetector CT imaging of the abdomen and pelvis was performed using the standard protocol following bolus administration of intravenous contrast. CONTRAST:  100 ml OMNIPAQUE IOHEXOL 300 MG/ML  SOLN COMPARISON:  None. FINDINGS: Lower chest: Heart size is upper normal. No pleural or pericardial effusion. Mild dependent atelectasis is seen in the lung bases. Hepatobiliary: Multiple large confluent masses are identified in the liver.  For example, lesion in the posterior right hepatic lobe measures 10 cm transverse by 11 cm AP on image 18. The liver is enlarged by these masses measuring 24 cm craniocaudal and displacing the pancreas to the left and right kidney inferiorly. The gallbladder and biliary tree are unremarkable. Pancreas: Unremarkable. No pancreatic ductal dilatation or surrounding inflammatory changes. Spleen: Normal in size without focal abnormality. Adrenals/Urinary Tract: Nonobstructing stone midpole left kidney measures 0.5 cm in diameter. The kidneys are otherwise unremarkable. Wall thickening is seen in almost completely decompressed urinary bladder. The adrenal glands are unremarkable. Stomach/Bowel: Surgical anastomosis near the rectosigmoid colon is consistent with prior colon resection. There also appears to be a small bowel anastomosis in the pelvis. No bowel obstruction or inflammatory change. The stomach is unremarkable. The appendix is normal. Vascular/Lymphatic: Scattered atherosclerotic calcifications are seen. No lymphadenopathy. Reproductive: Prostate is unremarkable. Other: No intra-abdominal fluid collection or free air. Musculoskeletal: No lytic or sclerotic lesion is identified. Severe bilateral hip osteoarthritis is advanced for age. Degenerative disc disease L1-2 and L4-5 is also identified. IMPRESSION: Multiple large confluent liver masses most likely due  to metastatic disease in this patient with a history of colon cancer. The masses result in hepatomegaly and inferior displacement of the right kidney and leftward displacement of the pancreas. Postoperative change of the colon without evidence of complication. Nonobstructing stone midpole left kidney. Wall thickening of the urinary bladder is likely related to underdistention rather than cystitis. 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 Electronically Signed   By: Inge Rise M.D.   On: 05/07/2018 13:30   US Biopsy  (liver)  Result Date: 05/20/2018 CLINICAL DATA:  History of colorectal carcinoma with large hepatic mass lesions. Biopsy of the liver is performed to confirm presence of metastatic disease. EXAM: ULTRASOUND GUIDED CORE BIOPSY OF LIVER COMPARISON:  CT of the abdomen on 05/07/2018 MEDICATIONS: 1.0 mg IV Versed; 50 mcg IV Fentanyl Total Moderate Sedation Time: 14 minutes. The patient's level of consciousness and physiologic status were continuously monitored during the procedure by Radiology nursing. PROCEDURE: The procedure, risks, benefits, and alternatives were explained to the patient. Questions regarding the procedure were encouraged and answered. The patient understands and consents to the procedure. A time out was performed prior to initiating the procedure. Ultrasound was used to localize lesions in the liver. The abdominal wall was prepped with chlorhexidine in a sterile fashion, and a sterile drape was applied covering the operative field. A sterile gown and sterile gloves were used for the procedure. Local anesthesia was provided with 1% Lidocaine. Under direct ultrasound guidance, a 17 gauge trocar needle was advanced into the right lobe of the liver. 76 gauge core biopsy samples were obtained at the level of a hepatic mass. Four samples were obtained and submitted in formalin. After the procedure Gel-Foam pledgets were advanced through the outer needle as it was retracted. Additional ultrasound was performed. COMPLICATIONS: None. FINDINGS: There is a huge tumor burden throughout the liver as seen by recent CT. Solid tissue was obtained from a lesion in the right lobe. IMPRESSION: Ultrasound-guided core biopsy performed of a mass lesion within the right lobe of the liver. Electronically Signed   By: Aletta Edouard M.D.   On: 05/20/2018 14:36   ONCOLOGY HISTORY: The primary rectosigmoid tumor was resected in July 2018, mucinous adenocarcinoma pT4a pN2b.  MMR IHC and MSI testing were performed on the  primary tumor with the following results:   MLH1: Normal expression.  PMS2: Normal expression.  MSH2: Normal expression.  MSH6: Normal expression.  MSI testing by PCR: Microsatellite stable (MSS).   ASSESSMENT: Stage IVa mucinous adenocarcinoma of colon.  PLAN:    1. Stage IVa mucinous adenocarcinoma of colon: See original pathology results as above.  Patient's recent liver biopsy on May 20, 2018 was consistent with outside pathology.  Patient was scheduled for chemotherapy, but was unable to attend appointments and noncompliant with follow-up.  CT scan results from May 07, 2018 reviewed independently and report as above with large confluent liver masses likely due to metastatic disease.  Patient CA-19-9 and CEA are both elevated.  Patient is agreed to pursue palliative chemotherapy with FOLFOX plus Avastin.  He will require port placement prior to treatment.  Return to clinic on June 14, 2018 for further evaluation and consideration of cycle 1.  2.  Anemia: Patient's most recent hemoglobin 10.9.  Monitor.  I spent a total of 30 minutes face-to-face with the patient of which greater than 50% of the visit was spent in counseling and coordination of care as detailed above.   Patient expressed understanding and was in agreement  with this plan. He also understands that He can call clinic at any time with any questions, concerns, or complaints.   Cancer Staging No matching staging information was found for the patient.  Lloyd Huger, MD   06/06/2018 6:40 AM

## 2018-06-01 ENCOUNTER — Telehealth: Payer: Self-pay

## 2018-06-01 NOTE — Telephone Encounter (Signed)
Verified with Manuel Williamson that the cancer center Manuel [facility] will pick up Manuel Williamson 8/7 at 0915 for his appointment with Dr. Grayland Ormond. They will pick him up at the [facility], [place], room [address]. Oncology Nurse Navigator Documentation  Navigator Location: [facility] (06/01/18 1000)   )Navigator Encounter Type: Telephone (06/01/18 1000) Telephone: Incoming Call;Appt Confirmation/Clarification (06/01/18 1000)                                                  Time Spent with Patient: 15 (06/01/18 1000)

## 2018-06-02 ENCOUNTER — Encounter: Payer: Self-pay | Admitting: Oncology

## 2018-06-02 ENCOUNTER — Other Ambulatory Visit: Payer: Self-pay | Admitting: *Deleted

## 2018-06-02 ENCOUNTER — Inpatient Hospital Stay: Payer: 59 | Attending: Oncology | Admitting: Oncology

## 2018-06-02 VITALS — BP 162/99 | HR 85 | Temp 96.5°F | Resp 18 | Wt 170.2 lb

## 2018-06-02 DIAGNOSIS — D376 Neoplasm of uncertain behavior of liver, gallbladder and bile ducts: Secondary | ICD-10-CM

## 2018-06-02 DIAGNOSIS — D649 Anemia, unspecified: Secondary | ICD-10-CM

## 2018-06-02 DIAGNOSIS — R970 Elevated carcinoembryonic antigen [CEA]: Secondary | ICD-10-CM

## 2018-06-02 DIAGNOSIS — Z7189 Other specified counseling: Secondary | ICD-10-CM

## 2018-06-02 DIAGNOSIS — C189 Malignant neoplasm of colon, unspecified: Secondary | ICD-10-CM

## 2018-06-02 DIAGNOSIS — R978 Other abnormal tumor markers: Secondary | ICD-10-CM | POA: Diagnosis not present

## 2018-06-02 MED ORDER — PROCHLORPERAZINE MALEATE 10 MG PO TABS: 10.0000 mg | ORAL_TABLET | Freq: Four times a day (QID) | ORAL | 0 refills | Status: DC | PRN

## 2018-06-02 MED ORDER — ONDANSETRON HCL 8 MG PO TABS: 8.0000 mg | ORAL_TABLET | Freq: Three times a day (TID) | ORAL | 0 refills | Status: AC | PRN

## 2018-06-02 MED ORDER — LIDOCAINE-PRILOCAINE 2.5-2.5 % EX CREA: 1.0000 "application " | TOPICAL_CREAM | CUTANEOUS | 1 refills | Status: DC | PRN

## 2018-06-02 MED ORDER — PROCHLORPERAZINE MALEATE 10 MG PO TABS: 10.0000 mg | ORAL_TABLET | Freq: Four times a day (QID) | ORAL | 0 refills | Status: AC | PRN

## 2018-06-02 MED ORDER — LIDOCAINE-PRILOCAINE 2.5-2.5 % EX CREA: 1.0000 "application " | TOPICAL_CREAM | CUTANEOUS | 1 refills | Status: AC | PRN

## 2018-06-02 MED ORDER — OXYCODONE-ACETAMINOPHEN 5-325 MG PO TABS: 1.0000 | ORAL_TABLET | ORAL | 0 refills | Status: DC | PRN

## 2018-06-02 MED ORDER — ONDANSETRON HCL 8 MG PO TABS: 8.0000 mg | ORAL_TABLET | Freq: Three times a day (TID) | ORAL | 0 refills | Status: DC | PRN

## 2018-06-02 NOTE — Progress Notes (Signed)
Pt in for follow up and biopsy results.  

## 2018-06-02 NOTE — Progress Notes (Signed)
Met with Mr. Manuel Williamson and his spouse. He has several barriers to care. He has been dismissed from the homeless shelter due to receiving a disability check. Currently he is staying at the [facility] on [address], room [address]. This is being provided by our charitable foundation through 8/15. After this date he reports his son, that lives in [place], is going to help him. They think their son may continue to pay for the hotel room. At this time they also report they are unsure if they will stay in this area. He has decided he would like to try chemotherapy as discussed by Dr. Grayland Ormond. I have went over all upcoming appointments with him and his spouse, and provided them in writing as well. They are in contact with PSN, Barnabas Lister for other financial issues. He has my contact information for any further needs. Will continue to follow. Oncology Nurse Navigator Documentation  Navigator Location: [facility] (06/02/18 1200)   )Navigator Encounter Type: Follow-up Appt (06/02/18 1200) Telephone: Education;Patient Update (06/02/18 1200)                                                  Time Spent with Patient: 30 (06/02/18 1200)

## 2018-06-04 NOTE — Patient Instructions (Signed)
Bevacizumab injection What is this medicine? BEVACIZUMAB (be va SIZ yoo mab) is a monoclonal antibody. It is used to treat many types of cancer. This medicine may be used for other purposes; ask your health care provider or pharmacist if you have questions. COMMON BRAND NAME(S): Avastin What should I tell my health care provider before I take this medicine? They need to know if you have any of these conditions: -diabetes -heart disease -high blood pressure -history of coughing up blood -prior anthracycline chemotherapy (e.g., doxorubicin, daunorubicin, epirubicin) -recent or ongoing radiation therapy -recent or planning to have surgery -stroke -an unusual or allergic reaction to bevacizumab, hamster proteins, mouse proteins, other medicines, foods, dyes, or preservatives -pregnant or trying to get pregnant -breast-feeding How should I use this medicine? This medicine is for infusion into a vein. It is given by a health care professional in a hospital or clinic setting. Talk to your pediatrician regarding the use of this medicine in children. Special care may be needed. Overdosage: If you think you have taken too much of this medicine contact a poison control center or emergency room at once. NOTE: This medicine is only for you. Do not share this medicine with others. What if I miss a dose? It is important not to miss your dose. Call your doctor or health care professional if you are unable to keep an appointment. What may interact with this medicine? Interactions are not expected. This list may not describe all possible interactions. Give your health care provider a list of all the medicines, herbs, non-prescription drugs, or dietary supplements you use. Also tell them if you smoke, drink alcohol, or use illegal drugs. Some items may interact with your medicine. What should I watch for while using this medicine? Your condition will be monitored carefully while you are receiving this  medicine. You will need important blood work and urine testing done while you are taking this medicine. This medicine may increase your risk to bruise or bleed. Call your doctor or health care professional if you notice any unusual bleeding. This medicine should be started at least 28 days following major surgery and the site of the surgery should be totally healed. Check with your doctor before scheduling dental work or surgery while you are receiving this treatment. Talk to your doctor if you have recently had surgery or if you have a wound that has not healed. Do not become pregnant while taking this medicine or for 6 months after stopping it. Women should inform their doctor if they wish to become pregnant or think they might be pregnant. There is a potential for serious side effects to an unborn child. Talk to your health care professional or pharmacist for more information. Do not breast-feed an infant while taking this medicine and for 6 months after the last dose. This medicine has caused ovarian failure in some women. This medicine may interfere with the ability to have a child. You should talk to your doctor or health care professional if you are concerned about your fertility. What side effects may I notice from receiving this medicine? Side effects that you should report to your doctor or health care professional as soon as possible: -allergic reactions like skin rash, itching or hives, swelling of the face, lips, or tongue -chest pain or chest tightness -chills -coughing up blood -high fever -seizures -severe constipation -signs and symptoms of bleeding such as bloody or black, tarry stools; red or dark-brown urine; spitting up blood or brown material that looks   like coffee grounds; red spots on the skin; unusual bruising or bleeding from the eye, gums, or nose -signs and symptoms of a blood clot such as breathing problems; chest pain; severe, sudden headache; pain, swelling, warmth in  the leg -signs and symptoms of a stroke like changes in vision; confusion; trouble speaking or understanding; severe headaches; sudden numbness or weakness of the face, arm or leg; trouble walking; dizziness; loss of balance or coordination -stomach pain -sweating -swelling of legs or ankles -vomiting -weight gain Side effects that usually do not require medical attention (report to your doctor or health care professional if they continue or are bothersome): -back pain -changes in taste -decreased appetite -dry skin -nausea -tiredness This list may not describe all possible side effects. Call your doctor for medical advice about side effects. You may report side effects to FDA at 1-800-FDA-1088. Where should I keep my medicine? This drug is given in a hospital or clinic and will not be stored at home. NOTE: This sheet is a summary. It may not cover all possible information. If you have questions about this medicine, talk to your doctor, pharmacist, or health care provider.  2018 Elsevier/Gold Standard (2016-10-10 14:33:29) Oxaliplatin Injection What is this medicine? OXALIPLATIN (ox AL i PLA tin) is a chemotherapy drug. It targets fast dividing cells, like cancer cells, and causes these cells to die. This medicine is used to treat cancers of the colon and rectum, and many other cancers. This medicine may be used for other purposes; ask your health care provider or pharmacist if you have questions. COMMON BRAND NAME(S): Eloxatin What should I tell my health care provider before I take this medicine? They need to know if you have any of these conditions: -kidney disease -an unusual or allergic reaction to oxaliplatin, other chemotherapy, other medicines, foods, dyes, or preservatives -pregnant or trying to get pregnant -breast-feeding How should I use this medicine? This drug is given as an infusion into a vein. It is administered in a hospital or clinic by a specially trained health  care professional. Talk to your pediatrician regarding the use of this medicine in children. Special care may be needed. Overdosage: If you think you have taken too much of this medicine contact a poison control center or emergency room at once. NOTE: This medicine is only for you. Do not share this medicine with others. What if I miss a dose? It is important not to miss a dose. Call your doctor or health care professional if you are unable to keep an appointment. What may interact with this medicine? -medicines to increase blood counts like filgrastim, pegfilgrastim, sargramostim -probenecid -some antibiotics like amikacin, gentamicin, neomycin, polymyxin B, streptomycin, tobramycin -zalcitabine Talk to your doctor or health care professional before taking any of these medicines: -acetaminophen -aspirin -ibuprofen -ketoprofen -naproxen This list may not describe all possible interactions. Give your health care provider a list of all the medicines, herbs, non-prescription drugs, or dietary supplements you use. Also tell them if you smoke, drink alcohol, or use illegal drugs. Some items may interact with your medicine. What should I watch for while using this medicine? Your condition will be monitored carefully while you are receiving this medicine. You will need important blood work done while you are taking this medicine. This medicine can make you more sensitive to cold. Do not drink cold drinks or use ice. Cover exposed skin before coming in contact with cold temperatures or cold objects. When out in cold weather wear warm clothing   and cover your mouth and nose to warm the air that goes into your lungs. Tell your doctor if you get sensitive to the cold. This drug may make you feel generally unwell. This is not uncommon, as chemotherapy can affect healthy cells as well as cancer cells. Report any side effects. Continue your course of treatment even though you feel ill unless your doctor tells  you to stop. In some cases, you may be given additional medicines to help with side effects. Follow all directions for their use. Call your doctor or health care professional for advice if you get a fever, chills or sore throat, or other symptoms of a cold or flu. Do not treat yourself. This drug decreases your body's ability to fight infections. Try to avoid being around people who are sick. This medicine may increase your risk to bruise or bleed. Call your doctor or health care professional if you notice any unusual bleeding. Be careful brushing and flossing your teeth or using a toothpick because you may get an infection or bleed more easily. If you have any dental work done, tell your dentist you are receiving this medicine. Avoid taking products that contain aspirin, acetaminophen, ibuprofen, naproxen, or ketoprofen unless instructed by your doctor. These medicines may hide a fever. Do not become pregnant while taking this medicine. Women should inform their doctor if they wish to become pregnant or think they might be pregnant. There is a potential for serious side effects to an unborn child. Talk to your health care professional or pharmacist for more information. Do not breast-feed an infant while taking this medicine. Call your doctor or health care professional if you get diarrhea. Do not treat yourself. What side effects may I notice from receiving this medicine? Side effects that you should report to your doctor or health care professional as soon as possible: -allergic reactions like skin rash, itching or hives, swelling of the face, lips, or tongue -low blood counts - This drug may decrease the number of white blood cells, red blood cells and platelets. You may be at increased risk for infections and bleeding. -signs of infection - fever or chills, cough, sore throat, pain or difficulty passing urine -signs of decreased platelets or bleeding - bruising, pinpoint red spots on the skin,  black, tarry stools, nosebleeds -signs of decreased red blood cells - unusually weak or tired, fainting spells, lightheadedness -breathing problems -chest pain, pressure -cough -diarrhea -jaw tightness -mouth sores -nausea and vomiting -pain, swelling, redness or irritation at the injection site -pain, tingling, numbness in the hands or feet -problems with balance, talking, walking -redness, blistering, peeling or loosening of the skin, including inside the mouth -trouble passing urine or change in the amount of urine Side effects that usually do not require medical attention (report to your doctor or health care professional if they continue or are bothersome): -changes in vision -constipation -hair loss -loss of appetite -metallic taste in the mouth or changes in taste -stomach pain This list may not describe all possible side effects. Call your doctor for medical advice about side effects. You may report side effects to FDA at 1-800-FDA-1088. Where should I keep my medicine? This drug is given in a hospital or clinic and will not be stored at home. NOTE: This sheet is a summary. It may not cover all possible information. If you have questions about this medicine, talk to your doctor, pharmacist, or health care provider.  2018 Elsevier/Gold Standard (2008-05-09 17:22:47) Fluorouracil, 5-FU injection What is   this medicine? FLUOROURACIL, 5-FU (flure oh YOOR a sil) is a chemotherapy drug. It slows the growth of cancer cells. This medicine is used to treat many types of cancer like breast cancer, colon or rectal cancer, pancreatic cancer, and stomach cancer. This medicine may be used for other purposes; ask your health care provider or pharmacist if you have questions. COMMON BRAND NAME(S): Adrucil What should I tell my health care provider before I take this medicine? They need to know if you have any of these conditions: -blood disorders -dihydropyrimidine dehydrogenase (DPD)  deficiency -infection (especially a virus infection such as chickenpox, cold sores, or herpes) -kidney disease -liver disease -malnourished, poor nutrition -recent or ongoing radiation therapy -an unusual or allergic reaction to fluorouracil, other chemotherapy, other medicines, foods, dyes, or preservatives -pregnant or trying to get pregnant -breast-feeding How should I use this medicine? This drug is given as an infusion or injection into a vein. It is administered in a hospital or clinic by a specially trained health care professional. Talk to your pediatrician regarding the use of this medicine in children. Special care may be needed. Overdosage: If you think you have taken too much of this medicine contact a poison control center or emergency room at once. NOTE: This medicine is only for you. Do not share this medicine with others. What if I miss a dose? It is important not to miss your dose. Call your doctor or health care professional if you are unable to keep an appointment. What may interact with this medicine? -allopurinol -cimetidine -dapsone -digoxin -hydroxyurea -leucovorin -levamisole -medicines for seizures like ethotoin, fosphenytoin, phenytoin -medicines to increase blood counts like filgrastim, pegfilgrastim, sargramostim -medicines that treat or prevent blood clots like warfarin, enoxaparin, and dalteparin -methotrexate -metronidazole -pyrimethamine -some other chemotherapy drugs like busulfan, cisplatin, estramustine, vinblastine -trimethoprim -trimetrexate -vaccines Talk to your doctor or health care professional before taking any of these medicines: -acetaminophen -aspirin -ibuprofen -ketoprofen -naproxen This list may not describe all possible interactions. Give your health care provider a list of all the medicines, herbs, non-prescription drugs, or dietary supplements you use. Also tell them if you smoke, drink alcohol, or use illegal drugs. Some items  may interact with your medicine. What should I watch for while using this medicine? Visit your doctor for checks on your progress. This drug may make you feel generally unwell. This is not uncommon, as chemotherapy can affect healthy cells as well as cancer cells. Report any side effects. Continue your course of treatment even though you feel ill unless your doctor tells you to stop. In some cases, you may be given additional medicines to help with side effects. Follow all directions for their use. Call your doctor or health care professional for advice if you get a fever, chills or sore throat, or other symptoms of a cold or flu. Do not treat yourself. This drug decreases your body's ability to fight infections. Try to avoid being around people who are sick. This medicine may increase your risk to bruise or bleed. Call your doctor or health care professional if you notice any unusual bleeding. Be careful brushing and flossing your teeth or using a toothpick because you may get an infection or bleed more easily. If you have any dental work done, tell your dentist you are receiving this medicine. Avoid taking products that contain aspirin, acetaminophen, ibuprofen, naproxen, or ketoprofen unless instructed by your doctor. These medicines may hide a fever. Do not become pregnant while taking this medicine. Women should inform   their doctor if they wish to become pregnant or think they might be pregnant. There is a potential for serious side effects to an unborn child. Talk to your health care professional or pharmacist for more information. Do not breast-feed an infant while taking this medicine. Men should inform their doctor if they wish to father a child. This medicine may lower sperm counts. Do not treat diarrhea with over the counter products. Contact your doctor if you have diarrhea that lasts more than 2 days or if it is severe and watery. This medicine can make you more sensitive to the sun. Keep out  of the sun. If you cannot avoid being in the sun, wear protective clothing and use sunscreen. Do not use sun lamps or tanning beds/booths. What side effects may I notice from receiving this medicine? Side effects that you should report to your doctor or health care professional as soon as possible: -allergic reactions like skin rash, itching or hives, swelling of the face, lips, or tongue -low blood counts - this medicine may decrease the number of white blood cells, red blood cells and platelets. You may be at increased risk for infections and bleeding. -signs of infection - fever or chills, cough, sore throat, pain or difficulty passing urine -signs of decreased platelets or bleeding - bruising, pinpoint red spots on the skin, black, tarry stools, blood in the urine -signs of decreased red blood cells - unusually weak or tired, fainting spells, lightheadedness -breathing problems -changes in vision -chest pain -mouth sores -nausea and vomiting -pain, swelling, redness at site where injected -pain, tingling, numbness in the hands or feet -redness, swelling, or sores on hands or feet -stomach pain -unusual bleeding Side effects that usually do not require medical attention (report to your doctor or health care professional if they continue or are bothersome): -changes in finger or toe nails -diarrhea -dry or itchy skin -hair loss -headache -loss of appetite -sensitivity of eyes to the light -stomach upset -unusually teary eyes This list may not describe all possible side effects. Call your doctor for medical advice about side effects. You may report side effects to FDA at 1-800-FDA-1088. Where should I keep my medicine? This drug is given in a hospital or clinic and will not be stored at home. NOTE: This sheet is a summary. It may not cover all possible information. If you have questions about this medicine, talk to your doctor, pharmacist, or health care provider.  2018  Elsevier/Gold Standard (2008-02-16 13:53:16) Leucovorin injection What is this medicine? LEUCOVORIN (loo koe VOR in) is used to prevent or treat the harmful effects of some medicines. This medicine is used to treat anemia caused by a low amount of folic acid in the body. It is also used with 5-fluorouracil (5-FU) to treat colon cancer. This medicine may be used for other purposes; ask your health care provider or pharmacist if you have questions. What should I tell my health care provider before I take this medicine? They need to know if you have any of these conditions: -anemia from low levels of vitamin B-12 in the blood -an unusual or allergic reaction to leucovorin, folic acid, other medicines, foods, dyes, or preservatives -pregnant or trying to get pregnant -breast-feeding How should I use this medicine? This medicine is for injection into a muscle or into a vein. It is given by a health care professional in a hospital or clinic setting. Talk to your pediatrician regarding the use of this medicine in children. Special care   may be needed. Overdosage: If you think you have taken too much of this medicine contact a poison control center or emergency room at once. NOTE: This medicine is only for you. Do not share this medicine with others. What if I miss a dose? This does not apply. What may interact with this medicine? -capecitabine -fluorouracil -phenobarbital -phenytoin -primidone -trimethoprim-sulfamethoxazole This list may not describe all possible interactions. Give your health care provider a list of all the medicines, herbs, non-prescription drugs, or dietary supplements you use. Also tell them if you smoke, drink alcohol, or use illegal drugs. Some items may interact with your medicine. What should I watch for while using this medicine? Your condition will be monitored carefully while you are receiving this medicine. This medicine may increase the side effects of  5-fluorouracil, 5-FU. Tell your doctor or health care professional if you have diarrhea or mouth sores that do not get better or that get worse. What side effects may I notice from receiving this medicine? Side effects that you should report to your doctor or health care professional as soon as possible: -allergic reactions like skin rash, itching or hives, swelling of the face, lips, or tongue -breathing problems -fever, infection -mouth sores -unusual bleeding or bruising -unusually weak or tired Side effects that usually do not require medical attention (report to your doctor or health care professional if they continue or are bothersome): -constipation or diarrhea -loss of appetite -nausea, vomiting This list may not describe all possible side effects. Call your doctor for medical advice about side effects. You may report side effects to FDA at 1-800-FDA-1088. Where should I keep my medicine? This drug is given in a hospital or clinic and will not be stored at home. NOTE: This sheet is a summary. It may not cover all possible information. If you have questions about this medicine, talk to your doctor, pharmacist, or health care provider.  2018 Elsevier/Gold Standard (2008-04-18 16:50:29)  

## 2018-06-06 MED ORDER — LIDOCAINE-PRILOCAINE 2.5-2.5 % EX CREA: TOPICAL_CREAM | CUTANEOUS | 3 refills | Status: AC

## 2018-06-06 NOTE — Progress Notes (Signed)
START ON PATHWAY REGIMEN - Colorectal     A cycle is every 14 days:     Oxaliplatin      Leucovorin      5-Fluorouracil      5-Fluorouracil      Bevacizumab   **Always confirm dose/schedule in your pharmacy ordering system**  Patient Characteristics: Metastatic Colorectal, First Line, Nonsurgical Candidate, KRAS/NRAS Wild-Type, BRAF Wild-Type/Unknown, PS = 0,1; Bevacizumab Eligible Current evidence of distant metastases<= Yes AJCC T Category: T4a AJCC N Category: N2b AJCC M Category: M1a AJCC 8 Stage Grouping: IVA BRAF Mutation Status: Wild-Type (no mutation) KRAS/NRAS Mutation Status: Wild-Type (no mutation) Line of therapy: First Environmental consultant Status: PS = 0, 1 Bevacizumab Eligibility: Eligible Intent of Therapy: Non-Curative / Palliative Intent, Discussed with Patient

## 2018-06-07 ENCOUNTER — Encounter (INDEPENDENT_AMBULATORY_CARE_PROVIDER_SITE_OTHER): Payer: Self-pay

## 2018-06-07 ENCOUNTER — Inpatient Hospital Stay: Payer: 59

## 2018-06-07 ENCOUNTER — Inpatient Hospital Stay: Payer: 59 | Admitting: Nurse Practitioner

## 2018-06-07 ENCOUNTER — Other Ambulatory Visit: Payer: Self-pay | Admitting: *Deleted

## 2018-06-07 ENCOUNTER — Telehealth: Payer: Self-pay

## 2018-06-07 ENCOUNTER — Other Ambulatory Visit (INDEPENDENT_AMBULATORY_CARE_PROVIDER_SITE_OTHER): Payer: Self-pay | Admitting: Nurse Practitioner

## 2018-06-07 MED ORDER — TRAMADOL HCL 50 MG PO TABS: 50.0000 mg | ORAL_TABLET | Freq: Four times a day (QID) | ORAL | 0 refills | Status: AC | PRN

## 2018-06-07 NOTE — Telephone Encounter (Signed)
Met with Mr. Enck and his spouse during chemo class break. He has secured housing that the cancer center provided with the charitable foundation through 8/15. At this time the son was going to assist with housing. They report he has gotten them a place to stay but it will not be until next month. They are asking if we can extend the hotel stay until that time. I have spoken with Jack/charitable foundation and unfortunately we are unable to extend his stay. Educated further on how our charitable contributions work. They also asked for food voucher. Provided voucher for [employer]. Address provided and they were instructed that he would need to provide a picture ID and secure their own transportation. The cancer center [facility] cannot provide this transportation. Ms. Ptacek further reports that his current pain medication, oxycodone-acetaminophen is making him "very sick on his stomach" and his asking for something different. Specifically asking for tramadol. Instructed that I will need to defer this to Dr. Grayland Ormond and his team. They will call him regarding this. Oncology Nurse Navigator Documentation  Navigator Location: [facility] (06/07/18 1000)   )Navigator Encounter Type: Lobby (06/07/18 1000) Telephone: Financial Assistance (06/07/18 1000)                                                  Time Spent with Patient: 30 (06/07/18 1000)

## 2018-06-09 MED ORDER — CEFAZOLIN SODIUM-DEXTROSE 2-4 GM/100ML-% IV SOLN: 2.0000 g | INTRAVENOUS | Status: DC

## 2018-06-10 ENCOUNTER — Telehealth: Payer: Self-pay

## 2018-06-10 ENCOUNTER — Telehealth (INDEPENDENT_AMBULATORY_CARE_PROVIDER_SITE_OTHER): Payer: Self-pay

## 2018-06-10 ENCOUNTER — Encounter: Admission: RE | Payer: Self-pay | Source: Ambulatory Visit

## 2018-06-10 ENCOUNTER — Ambulatory Visit: Admission: RE | Admit: 2018-06-10 | Payer: 59 | Source: Ambulatory Visit | Admitting: Vascular Surgery

## 2018-06-10 SURGERY — PORTA CATH INSERTION
Anesthesia: Moderate Sedation

## 2018-06-10 NOTE — Telephone Encounter (Signed)
Notified this am that Mr. Banik is not coming for his port placement due to his housing situation. The cancer center has provided a hotel for an extended stay. Today is the last day we are able to provide this service. Notified special procedures and they are notifying Dr. Lucky Cowboy. Dr. Gary Fleet team notified that he will not be coming for port today.  Oncology Nurse Navigator Documentation  Navigator Location: [facility] (06/10/18 0900)   )Navigator Encounter Type: Other (06/10/18 0900)                                                    Time Spent with Patient: 15 (06/10/18 0900)

## 2018-06-10 NOTE — Telephone Encounter (Signed)
Devona Konig, CMA  Wofford, Tammy C        I did call the patient back yesterday and left a message on his voicemail regarding his procedure.   Previous Messages    ----- Message -----  From: Harriett Sine  Sent: 06/10/2018  8:06 AM EDT  To: [facility]   Pt called back would like for someone to give him a call.Michela Pitcher he is at [employer] [facility] and they are putting him out today,so he will be walking the streets.He didn't leave a phone number, he did leave a message yesterday with a phone number.      Patient called yesterday as well and I attempted to return his call and had to leave a message on the voicemail regarding his procedure.

## 2018-06-14 ENCOUNTER — Telehealth: Payer: Self-pay

## 2018-06-14 NOTE — Telephone Encounter (Signed)
Called and spoke with Mr. Hagadorn's spouse regarding appointment that is scheduled for tomorrow. She states that they have gotten the hotel room extended and they are trying to find a way to stay there. They are supposed to check out tomorrow per her. Educated that Dr. Grayland Ormond would like to discuss and possibly treat with a modified chemo regimen since he did not have port insertion. She states he will come to appt. Instructed that the [facility] will pick him up at [facility] as arranged previously. Oncology Nurse Navigator Documentation  Navigator Location: [facility] (06/14/18 1000)   )Navigator Encounter Type: Telephone (06/14/18 1000) Telephone: Lahoma Crocker Call;Appt Confirmation/Clarification (06/14/18 1000)                                                  Time Spent with Patient: 15 (06/14/18 1000)

## 2018-06-14 NOTE — Progress Notes (Deleted)
[place]  [phone] [phone]) [phone]  ID: Manuel Williamson OB: 17-Sep-1966  MR#: [id number]  MRA#:[id number]  Patient Care Team: Patient, No Pcp Per as PCP - General (General Practice) Clent Jacks, RN as Registered Nurse  CHIEF COMPLAINT: Stage IVa mucinous adenocarcinoma of colon.  INTERVAL HISTORY: Patient returns to clinic today for further evaluation and treatment planning.  He has increased weakness and fatigue.  He continues to have abdominal pain and a poor appetite.  He has increased nausea.  He has no neurologic complaints.  He denies any recent fevers.  He has no chest pain or shortness of breath.  He denies any vomiting, constipation, or diarrhea.  He has no melena or hematochezia.  He has no urinary complaints.  Patient offers no further specific complaints today.  REVIEW OF SYSTEMS:   Review of Systems  Constitutional: Positive for malaise/fatigue. Negative for fever and weight loss.  Respiratory: Negative.  Negative for cough, hemoptysis and shortness of breath.   Cardiovascular: Negative.  Negative for chest pain and leg swelling.  Gastrointestinal: Positive for nausea. Negative for abdominal pain, blood in stool and melena.  Genitourinary: Negative.  Negative for dysuria.  Musculoskeletal: Negative.  Negative for back pain.  Skin: Negative.  Negative for rash.  Neurological: Positive for weakness. Negative for sensory change, focal weakness and headaches.  Psychiatric/Behavioral: Negative.  The patient is not nervous/anxious.     As per HPI. Otherwise, a complete review of systems is negative.  PAST MEDICAL HISTORY: Past Medical History:  Diagnosis Date  . Anxiety   . Cataract   . Depression   . Diabetes mellitus without complication ([place])   . GERD (gastroesophageal reflux disease)   . Hypertension     PAST SURGICAL HISTORY: Past Surgical History:  Procedure Laterality Date  . BOWEL RESECTION  10/2017    FAMILY  HISTORY: No family history on file.  ADVANCED DIRECTIVES (Y/N):  N  HEALTH MAINTENANCE: Social History   Tobacco Use  . Smoking status: Never Smoker  . Smokeless tobacco: Never Used  Substance Use Topics  . Alcohol use: No    Comment: former drinking 3-4 40 oz /day. last use 1 mo ago per pt  . 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 diphenoxylate-atropine (LOMOTIL) 2.5-0.025 MG tablet Take 1 tablet by mouth 4 (four) times daily as needed for diarrhea or loose stools. (Patient not taking: Reported on 05/20/2018) 30 tablet 0  . hydrochlorothiazide (HYDRODIURIL) 25 MG tablet Take 1 tablet (25 mg total) by mouth daily. 30 tablet 1  . lidocaine-prilocaine (EMLA) cream Apply 1 application topically as needed. Apply to port 1-2 hours prior to chemotherapy appointment. Cover with plastic wrap. 30 g 1  . lidocaine-prilocaine (EMLA) cream Apply to affected area once 30 g 3  . loperamide (IMODIUM A-D) 2 MG tablet Take 1 tablet (2 mg total) by mouth 4 (four) times daily as needed for diarrhea or loose stools. (Patient not taking: Reported on 06/02/2018) 30 tablet 0  . metFORMIN (GLUCOPHAGE) 1000 MG tablet Take 500 mg by mouth 2 (two) times daily with a meal.    . ondansetron (ZOFRAN) 8 MG tablet Take 1 tablet (8 mg total) by mouth every 8 (eight) hours as needed for  nausea or vomiting. 30 tablet 0  . oxyCODONE-acetaminophen (PERCOCET) 5-325 MG tablet Take 1 tablet by mouth every 4 (four) hours as needed for severe pain. 30 tablet 0  . prochlorperazine (COMPAZINE) 10 MG tablet Take 1 tablet (10 mg total) by mouth every 6 (six) hours as needed for nausea or vomiting. 30 tablet 0  . traMADol (ULTRAM) 50 MG tablet Take 1 tablet (50 mg total) by mouth every 6 (six) hours as needed. Please fill on the  byrd fund 30 tablet 0   No current facility-administered medications for this visit.     OBJECTIVE: There were no vitals filed for this visit.   There is no height or weight on file to calculate BMI.    ECOG FS:0 - Asymptomatic  General: Well-developed, well-nourished, no acute distress. Eyes: Pink conjunctiva, anicteric sclera. HEENT: Normocephalic, moist mucous membranes. Lungs: Clear to auscultation bilaterally. Heart: Regular rate and rhythm. No rubs, murmurs, or gallops. Abdomen: Soft, nontender, nondistended. No organomegaly noted, normoactive bowel sounds. Musculoskeletal: No edema, cyanosis, or clubbing. Neuro: Alert, answering all questions appropriately. Cranial nerves grossly intact. Skin: No rashes or petechiae noted. Psych: Normal affect.  LAB RESULTS:  Lab Results  Component Value Date   NA 137 05/14/2018   K 3.9 05/14/2018   CL 99 05/14/2018   CO2 27 05/14/2018   GLUCOSE 110 (H) 05/14/2018   BUN 23 (H) 05/14/2018   CREATININE 1.14 05/14/2018   CALCIUM 9.6 05/14/2018   PROT 7.7 05/14/2018   ALBUMIN 4.0 05/14/2018   AST 40 05/14/2018   ALT 22 05/14/2018   ALKPHOS 181 (H) 05/14/2018   BILITOT 0.7 05/14/2018   GFRNONAA >60 05/14/2018   GFRAA >60 05/14/2018    Lab Results  Component Value Date   WBC 5.6 05/20/2018   NEUTROABS 4.2 05/14/2018   HGB 10.9 (L) 05/20/2018   HCT 33.2 (L) 05/20/2018   MCV 79.0 (L) 05/20/2018   PLT 359 05/20/2018     STUDIES: US Biopsy (liver)  Result Date: 05/20/2018 CLINICAL DATA:  History of colorectal carcinoma with large hepatic mass lesions. Biopsy of the liver is performed to confirm presence of metastatic disease. EXAM: ULTRASOUND GUIDED CORE BIOPSY OF LIVER COMPARISON:  CT of the abdomen on 05/07/2018 MEDICATIONS: 1.0 mg IV Versed; 50 mcg IV Fentanyl Total Moderate Sedation Time: 14 minutes. The patient's level of consciousness and physiologic status were continuously monitored during the procedure by Radiology  nursing. PROCEDURE: The procedure, risks, benefits, and alternatives were explained to the patient. Questions regarding the procedure were encouraged and answered. The patient understands and consents to the procedure. A time out was performed prior to initiating the procedure. Ultrasound was used to localize lesions in the liver. The abdominal wall was prepped with chlorhexidine in a sterile fashion, and a sterile drape was applied covering the operative field. A sterile gown and sterile gloves were used for the procedure. Local anesthesia was provided with 1% Lidocaine. Under direct ultrasound guidance, a 17 gauge trocar needle was advanced into the right lobe of the liver. 68 gauge core biopsy samples were obtained at the level of a hepatic mass. Four samples were obtained and submitted in formalin. After the procedure Gel-Foam pledgets were advanced through the outer needle as it was retracted. Additional ultrasound was performed. COMPLICATIONS: None. FINDINGS: There is a huge tumor burden throughout the liver as seen by recent CT. Solid tissue was obtained from a lesion in the right lobe. IMPRESSION: Ultrasound-guided core biopsy performed of a mass lesion within  the right lobe of the liver. Electronically Signed   By: Aletta Edouard M.D.   On: 05/20/2018 14:36   ONCOLOGY HISTORY: The primary rectosigmoid tumor was resected in July 2018, mucinous adenocarcinoma pT4a pN2b.  MMR IHC and MSI testing were performed on the primary tumor with the following results:   MLH1: Normal expression.  PMS2: Normal expression.  MSH2: Normal expression.  MSH6: Normal expression.  MSI testing by PCR: Microsatellite stable (MSS).   ASSESSMENT: Stage IVa mucinous adenocarcinoma of colon.  PLAN:    1. Stage IVa mucinous adenocarcinoma of colon: See original pathology results as above.  Patient's recent liver biopsy on May 20, 2018 was consistent with outside pathology.  Patient was scheduled for chemotherapy, but  was unable to attend appointments and noncompliant with follow-up.  CT scan results from May 07, 2018 reviewed independently and report as above with large confluent liver masses likely due to metastatic disease.  Patient CA-19-9 and CEA are both elevated.  Patient is agreed to pursue palliative chemotherapy with FOLFOX plus Avastin.  He will require port placement prior to treatment.  Return to clinic on June 14, 2018 for further evaluation and consideration of cycle 1.  2.  Anemia: Patient's most recent hemoglobin 10.9.  Monitor.  I spent a total of 30 minutes face-to-face with the patient of which greater than 50% of the visit was spent in counseling and coordination of care as detailed above.   Patient expressed understanding and was in agreement with this plan. He also understands that He can call clinic at any time with any questions, concerns, or complaints.   Cancer Staging Colon cancer [facility]) Staging form: Colon and Rectum, AJCC 8th Edition - Clinical stage from 06/06/2018: Stage IVA (cT4a, cN2b, pM1a) - Signed by Lloyd Huger, MD on 06/06/2018   Lloyd Huger, MD   06/14/2018 11:01 PM

## 2018-06-15 ENCOUNTER — Inpatient Hospital Stay: Payer: 59

## 2018-06-15 ENCOUNTER — Inpatient Hospital Stay: Payer: 59 | Admitting: Nurse Practitioner

## 2018-06-15 ENCOUNTER — Inpatient Hospital Stay: Payer: 59 | Admitting: Oncology

## 2018-06-15 NOTE — Progress Notes (Signed)
The cancer center [facility] went to pick Manuel Williamson up this am for his appointment with Dr. Grayland Ormond. He did not answer his phone. They knocked on the hotel room door and he did not answer. The front desk attempted to call the room and the line was busy. They [facility] driver left. Manuel Williamson called them and asked if they called his phone. They told him they did and were there to pick him up for his appointment. At that time they were unable to return to pick him up and his appointment had passed with Dr. Grayland Ormond. Around 1200 he and his spouse showed up in the cancer center lobby with all of their belongings stating we needed to do something to help them and that they had no where to go since checking out of the hotel. His spouse stating that "he cannot have that thing put in his chest with no place to go, he will be weak". Explained to Manuel Williamson that any treatment he would receive is ultimately up to him. At this time we have exhausted all of our resources for assistance. We have provided a hotel stay for a week, paid for his medication through our funds. Provided transportation to the pharmacy and to [employer] to utilize [employer] we provided. We are unable to provide any further lodging at this time. Inquired about him staying with his son in [place] until he has his housing figured out. They had previously reported that their son was getting them into a place next month. They stated that staying with son was not an option. He can no longer stay at homeless shelter due to getting a disability check. Did offer that the [place] could be utilized to get out of the heat. He is still reporting nausea. He does want to make another appointment with Dr. Grayland Ormond for next week. Instructed that for medication refills he would need to keep these appointments. Appointment made for next week and also provided in writing. At this time since he is not sure if he is coming to appointment we have not  arranged for the [facility]. Instructed to call if he is going to keep appointment and we can arrange for transportation to bring him to appointment. He and his spouse verbalized understanding. This conversation took place with Dr. Gary Fleet nurse, Manuel Williamson, present Oncology Nurse Navigator Documentation  Navigator Location: [facility] (06/15/18 1300)   )Navigator Encounter Type: Lobby (06/15/18 1300) Telephone: Patient Update (06/15/18 1300)                                                  Time Spent with Patient: 30 (06/15/18 1300)

## 2018-06-17 ENCOUNTER — Encounter: Payer: Self-pay | Admitting: Emergency Medicine

## 2018-06-17 ENCOUNTER — Other Ambulatory Visit: Payer: Self-pay

## 2018-06-17 ENCOUNTER — Emergency Department
Admission: EM | Admit: 2018-06-17 | Discharge: 2018-06-17 | Disposition: A | Discharge status: Home / Self Care | Payer: 59 | Attending: Emergency Medicine | Admitting: Emergency Medicine

## 2018-06-17 ENCOUNTER — Inpatient Hospital Stay: Payer: 59

## 2018-06-17 DIAGNOSIS — Z7984 Long term (current) use of oral hypoglycemic drugs: Secondary | ICD-10-CM | POA: Diagnosis not present

## 2018-06-17 DIAGNOSIS — C228 Malignant neoplasm of liver, primary, unspecified as to type: Secondary | ICD-10-CM | POA: Diagnosis not present

## 2018-06-17 DIAGNOSIS — E119 Type 2 diabetes mellitus without complications: Secondary | ICD-10-CM | POA: Insufficient documentation

## 2018-06-17 DIAGNOSIS — I10 Essential (primary) hypertension: Secondary | ICD-10-CM | POA: Diagnosis not present

## 2018-06-17 DIAGNOSIS — Z79899 Other long term (current) drug therapy: Secondary | ICD-10-CM | POA: Insufficient documentation

## 2018-06-17 DIAGNOSIS — R5383 Other fatigue: Secondary | ICD-10-CM

## 2018-06-17 DIAGNOSIS — C229 Malignant neoplasm of liver, not specified as primary or secondary: Secondary | ICD-10-CM

## 2018-06-17 LAB — COMPREHENSIVE METABOLIC PANEL
ALBUMIN: 4.1 g/dL (ref 3.5–5.0)
ALK PHOS: 195 U/L — AB (ref 38–126)
ALT: 25 U/L (ref 0–44)
AST: 72 U/L — ABNORMAL HIGH (ref 15–41)
Anion gap: 13 (ref 5–15)
BUN: 17 mg/dL (ref 6–20)
CALCIUM: 9.6 mg/dL (ref 8.9–10.3)
CO2: 25 mmol/L (ref 22–32)
Chloride: 99 mmol/L (ref 98–111)
Creatinine, Ser: 1.03 mg/dL (ref 0.61–1.24)
GFR calc Af Amer: >60 mL/min (ref >60)
GFR calc non Af Amer: >60 mL/min (ref >60)
GLUCOSE: 71 mg/dL (ref 70–99)
Potassium: 4 mmol/L (ref 3.5–5.1)
SODIUM: 137 mmol/L (ref 135–145)
Total Bilirubin: 1.6 mg/dL — ABNORMAL HIGH (ref 0.3–1.2)
Total Protein: 8.1 g/dL (ref 6.5–8.1)

## 2018-06-17 LAB — CBC WITH DIFFERENTIAL/PLATELET
BASOS PCT: 1 %
Basophils Absolute: 0.1 10*3/uL (ref 0–0.1)
Eosinophils Absolute: 0 10*3/uL (ref 0–0.7)
Eosinophils Relative: 1 %
HEMATOCRIT: 32.5 % — AB (ref 40.0–52.0)
Hemoglobin: 10.5 g/dL — ABNORMAL LOW (ref 13.0–18.0)
LYMPHS ABS: 0.4 10*3/uL — AB (ref 1.0–3.6)
LYMPHS PCT: 7 %
MCH: 25.6 pg — AB (ref 26.0–34.0)
MCHC: 32.4 g/dL (ref 32.0–36.0)
MCV: 79.2 fL — AB (ref 80.0–100.0)
MONO ABS: 0.8 10*3/uL (ref 0.2–1.0)
MONOS PCT: 14 %
Neutro Abs: 4.8 10*3/uL (ref 1.4–6.5)
Neutrophils Relative %: 77 %
Platelets: 369 10*3/uL (ref 150–440)
RBC: 4.1 MIL/uL — ABNORMAL LOW (ref 4.40–5.90)
RDW: 17.7 % — AB (ref 11.5–14.5)
WBC: 6.1 10*3/uL (ref 3.8–10.6)

## 2018-06-17 LAB — PROTIME-INR
INR: 1.26
Prothrombin Time: 15.7 seconds — ABNORMAL HIGH (ref 11.4–15.2)

## 2018-06-17 LAB — TYPE AND SCREEN
ABO/RH(D): A POS
Antibody Screen: NEGATIVE

## 2018-06-17 MED ORDER — ONDANSETRON HCL 4 MG/2ML IJ SOLN
4.0000 mg | Freq: Once | INTRAMUSCULAR | Status: AC
  Administered 2018-06-17: 4 mg via INTRAVENOUS
  Filled 2018-06-17: qty 2

## 2018-06-17 MED ORDER — ONDANSETRON HCL 4 MG/2ML IJ SOLN: 4.0000 mg | Freq: Once | INTRAMUSCULAR | Status: DC

## 2018-06-17 MED ORDER — MORPHINE SULFATE (PF) 4 MG/ML IV SOLN
4.0000 mg | Freq: Once | INTRAVENOUS | Status: AC
  Administered 2018-06-17: 4 mg via INTRAVENOUS
  Filled 2018-06-17: qty 1

## 2018-06-17 MED ORDER — OXYCODONE-ACETAMINOPHEN 5-325 MG PO TABS: 1.0000 | ORAL_TABLET | Freq: Four times a day (QID) | ORAL | 0 refills | Status: AC | PRN

## 2018-06-17 NOTE — ED Provider Notes (Signed)
[facility] Emergency Department Provider Note  ____________________________________________   First MD Initiated Contact with Patient 06/17/18 1337     (approximate)  I have reviewed the triage vital signs and the nursing notes.   HISTORY  Chief Complaint Weakness   HPI Manuel Williamson is a 52 y.o. male who comes to the emergency department via EMS with increasing fatigue and malaise for the past month.  The patient himself is a poor historian saying "you have to ask my wife about what is going on".  On chart review the patient has a past medical history of metastatic liver cancer and has been noncompliant with multiple oncology follow-ups including 3 days ago.  He is in some sort of disagreement with his wife and she is kicked him out of the house and he is currently living with a friend which is made his medical follow-up somewhat challenging.  He has not passed out.  He does have diffuse moderate severity constant cramping abdominal pain.  No diarrhea.  No chest pain or shortness of breath.  His symptoms are constant and nothing in particular seems to make them better or worse.    Past Medical History:  Diagnosis Date  . Anxiety   . Cataract   . Depression   . Diabetes mellitus without complication ([place])   . GERD (gastroesophageal reflux disease)   . Hypertension     Patient Active Problem List   Diagnosis Date Noted  . Goals of care, counseling/discussion 05/16/2018  . Colon cancer ([place]) 05/16/2018  . Diabetes ([place]) 04/11/2015  . Hypertension 04/11/2015    Past Surgical History:  Procedure Laterality Date  . BOWEL RESECTION  10/2017    Prior to Admission medications   Medication Sig Start Date End Date Taking? Authorizing Provider  diphenoxylate-atropine (LOMOTIL) 2.5-0.025 MG tablet Take 1 tablet by mouth 4 (four) times daily as needed for diarrhea or loose stools. Patient not taking: Reported on 05/20/2018 05/14/18   Jacquelin Hawking, NP    hydrochlorothiazide (HYDRODIURIL) 25 MG tablet Take 1 tablet (25 mg total) by mouth daily. 05/14/18   Jacquelin Hawking, NP  lidocaine-prilocaine (EMLA) cream Apply 1 application topically as needed. Apply to port 1-2 hours prior to chemotherapy appointment. Cover with plastic wrap. 06/02/18   Lloyd Huger, MD  lidocaine-prilocaine (EMLA) cream Apply to affected area once 06/06/18   Lloyd Huger, MD  loperamide (IMODIUM A-D) 2 MG tablet Take 1 tablet (2 mg total) by mouth 4 (four) times daily as needed for diarrhea or loose stools. Patient not taking: Reported on 06/02/2018 05/07/18   Rudene Re, MD  metFORMIN (GLUCOPHAGE) 1000 MG tablet Take 500 mg by mouth 2 (two) times daily with a meal.    [provider]  ondansetron (ZOFRAN) 8 MG tablet Take 1 tablet (8 mg total) by mouth every 8 (eight) hours as needed for nausea or vomiting. 06/02/18   Lloyd Huger, MD  oxyCODONE-acetaminophen (PERCOCET) 5-325 MG tablet Take 1 tablet by mouth every 6 (six) hours as needed for severe pain. 06/17/18   Darel Hong, MD  prochlorperazine (COMPAZINE) 10 MG tablet Take 1 tablet (10 mg total) by mouth every 6 (six) hours as needed for nausea or vomiting. 06/02/18   Lloyd Huger, MD  traMADol (ULTRAM) 50 MG tablet Take 1 tablet (50 mg total) by mouth every 6 (six) hours as needed. Please fill on the byrd fund 06/07/18   Lloyd Huger, MD    Allergies  Patient has no known allergies.  No family history on file.  Social History Social History   Tobacco Use  . Smoking status: Never Smoker  . Smokeless tobacco: Never Used  Substance Use Topics  . Alcohol use: No    Comment: former drinking 3-4 40 oz /day. last use 1 mo ago per pt  . 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 ENT: No sore throat. Cardiovascular: Denies chest  pain. Respiratory: Denies shortness of breath. Gastrointestinal: Positive for abdominal pain.  Positive for nausea, no vomiting.  No diarrhea.  No constipation. Genitourinary: Negative for dysuria. Musculoskeletal: Negative for back pain. Skin: Negative for rash. Neurological: Negative for headaches, focal weakness or numbness.   ____________________________________________   PHYSICAL EXAM:  VITAL SIGNS: ED Triage Vitals  Enc Vitals Group     BP 06/17/18 1333 (!) 149/103     Pulse Rate 06/17/18 1333 91     Resp 06/17/18 1333 16     Temp 06/17/18 1336 98.3 F (36.8 C)     Temp Source 06/17/18 1336 Oral     SpO2 06/17/18 1333 97 %     Weight --      Height --      Head Circumference --      Peak Flow --      Pain Score 06/17/18 1334 7     Pain Loc --      Pain Edu? --      Excl. in [place]? --     Constitutional: Alert and oriented x4 sad affect nontoxic no diaphoresis Eyes: PERRL EOMI. conjunctival pallor Head: Atraumatic. Nose: No congestion/rhinnorhea. Mouth/Throat: No trismus Neck: No stridor.   Cardiovascular: Normal rate, regular rhythm. Grossly normal heart sounds.  Good peripheral circulation. Respiratory: Normal respiratory effort.  No retractions. Lungs CTAB and moving good air Gastrointestinal: Liver edge palpated 3 cm below costal margin abdomen soft and mildly tender although no focality Musculoskeletal: No lower extremity edema   Neurologic:  Normal speech and language. No gross focal neurologic deficits are appreciated. Skin:  Skin is warm, dry and intact. No rash noted. Psychiatric: Sad affect   ____________________________________________   DIFFERENTIAL includes but not limited to  Dehydration, metabolic derangement, urinary tract infection, anemia ____________________________________________   LABS (all labs ordered are listed, but only abnormal results are displayed)  Labs Reviewed  COMPREHENSIVE METABOLIC PANEL - Abnormal; Notable for the  following components:      Result Value   AST 72 (*)    Alkaline Phosphatase 195 (*)    Total Bilirubin 1.6 (*)    All other components within normal limits  CBC WITH DIFFERENTIAL/PLATELET - Abnormal; Notable for the following components:   RBC 4.10 (*)    Hemoglobin 10.5 (*)    HCT 32.5 (*)    MCV 79.2 (*)    MCH 25.6 (*)    RDW 17.7 (*)    Lymphs Abs 0.4 (*)    All other components within normal limits  PROTIME-INR - Abnormal; Notable for the following components:   Prothrombin Time 15.7 (*)    All other components within normal limits  URINALYSIS, COMPLETE (UACMP) WITH MICROSCOPIC  TYPE AND SCREEN    Lab work reviewed by me with chronic anemia otherwise unremarkable __________________________________________  EKG  ED ECG REPORT I, Darel Hong, the attending physician, personally viewed and  interpreted this ECG.  Date: 06/17/2018 EKG Time:  Rate: 92 Rhythm: normal sinus rhythm QRS Axis: normal Intervals: normal ST/T Wave abnormalities: normal Narrative Interpretation: no evidence of acute ischemia  ____________________________________________  RADIOLOGY   ____________________________________________   PROCEDURES  Procedure(s) performed: no  Procedures  Critical Care performed: no  ____________________________________________   INITIAL IMPRESSION / ASSESSMENT AND PLAN / ED COURSE  Pertinent labs & imaging results that were available during my care of the patient were reviewed by me and considered in my medical decision making (see chart for details).   As part of my medical decision making, I reviewed the following data within the [place] History obtained from family if available, nursing notes, old chart and ekg, as well as notes from prior ED visits.  The patient arrives with generalized malaise, fatigue, and abdominal pain.  Differential is broad but includes anemia versus metabolic derangement etc.  We will establish an IV  give him IV morphine, gentle fluids, and lab work are pending.  Fortunately the patient's lab work is reassuring he does not need a blood transfusion at this point.  He says he is out of pain medication at home so I think is reasonable to give him a refill of his Percocet.  I will reach out to his oncologist to help him reestablish care.  The patient verbalizes understanding agreement the plan.      ____________________________________________   FINAL CLINICAL IMPRESSION(S) / ED DIAGNOSES  Final diagnoses:  Other fatigue  Malignant neoplasm of liver, unspecified liver malignancy type ([place])      NEW MEDICATIONS STARTED DURING THIS VISIT:  Current Discharge Medication List       Note:  This document was prepared using Dragon voice recognition software and may include unintentional dictation errors.     Darel Hong, MD 06/17/18 1606

## 2018-06-17 NOTE — Discharge Instructions (Signed)
It was a pleasure to take care of you today, and thank you for coming to our emergency department.  If you have any questions or concerns before leaving please ask the nurse to grab me and I'm more than happy to go through your aftercare instructions again.  If you were prescribed any opioid pain medication today such as Norco, Vicodin, Percocet, morphine, hydrocodone, or oxycodone please make sure you do not drive when you are taking this medication as it can alter your ability to drive safely.  If you have any concerns once you are home that you are not improving or are in fact getting worse before you can make it to your follow-up appointment, please do not hesitate to call 911 and come back for further evaluation.  Darel Hong, MD  Results for orders placed or performed during the hospital encounter of 06/17/18  Comprehensive metabolic panel  Result Value Ref Range   Sodium 137 135 - 145 mmol/L   Potassium 4.0 3.5 - 5.1 mmol/L   Chloride 99 98 - 111 mmol/L   CO2 25 22 - 32 mmol/L   Glucose, Bld 71 70 - 99 mg/dL   BUN 17 6 - 20 mg/dL   Creatinine, Ser 1.03 0.61 - 1.24 mg/dL   Calcium 9.6 8.9 - 10.3 mg/dL   Total Protein 8.1 6.5 - 8.1 g/dL   Albumin 4.1 3.5 - 5.0 g/dL   AST 72 (H) 15 - 41 U/L   ALT 25 0 - 44 U/L   Alkaline Phosphatase 195 (H) 38 - 126 U/L   Total Bilirubin 1.6 (H) 0.3 - 1.2 mg/dL   GFR calc non Af Amer >60 >60 mL/min   GFR calc Af Amer >60 >60 mL/min   Anion gap 13 5 - 15  CBC with Differential  Result Value Ref Range   WBC 6.1 3.8 - 10.6 K/uL   RBC 4.10 (L) 4.40 - 5.90 MIL/uL   Hemoglobin 10.5 (L) 13.0 - 18.0 g/dL   HCT 32.5 (L) 40.0 - 52.0 %   MCV 79.2 (L) 80.0 - 100.0 fL   MCH 25.6 (L) 26.0 - 34.0 pg   MCHC 32.4 32.0 - 36.0 g/dL   RDW 17.7 (H) 11.5 - 14.5 %   Platelets 369 150 - 440 K/uL   Neutrophils Relative % 77 %   Neutro Abs 4.8 1.4 - 6.5 K/uL   Lymphocytes Relative 7 %   Lymphs Abs 0.4 (L) 1.0 - 3.6 K/uL   Monocytes Relative 14 %   Monocytes  Absolute 0.8 0.2 - 1.0 K/uL   Eosinophils Relative 1 %   Eosinophils Absolute 0.0 0 - 0.7 K/uL   Basophils Relative 1 %   Basophils Absolute 0.1 0 - 0.1 K/uL  Protime-INR  Result Value Ref Range   Prothrombin Time 15.7 (H) 11.4 - 15.2 seconds   INR 1.26   Type and screen [place]  Result Value Ref Range   ABO/RH(D) A POS    Antibody Screen NEG    Sample Expiration      06/20/2018 Performed at [facility], [address] [place], [place] [address]    US Biopsy (liver)  Result Date: 05/20/2018 CLINICAL DATA:  History of colorectal carcinoma with large hepatic mass lesions. Biopsy of the liver is performed to confirm presence of metastatic disease. EXAM: ULTRASOUND GUIDED CORE BIOPSY OF LIVER COMPARISON:  CT of the abdomen on 05/07/2018 MEDICATIONS: 1.0 mg IV Versed; 50 mcg IV Fentanyl Total Moderate Sedation  Time: 14 minutes. The patient's level of consciousness and physiologic status were continuously monitored during the procedure by Radiology nursing. PROCEDURE: The procedure, risks, benefits, and alternatives were explained to the patient. Questions regarding the procedure were encouraged and answered. The patient understands and consents to the procedure. A time out was performed prior to initiating the procedure. Ultrasound was used to localize lesions in the liver. The abdominal wall was prepped with chlorhexidine in a sterile fashion, and a sterile drape was applied covering the operative field. A sterile gown and sterile gloves were used for the procedure. Local anesthesia was provided with 1% Lidocaine. Under direct ultrasound guidance, a 17 gauge trocar needle was advanced into the right lobe of the liver. 55 gauge core biopsy samples were obtained at the level of a hepatic mass. Four samples were obtained and submitted in formalin. After the procedure Gel-Foam pledgets were advanced through the outer needle as it was retracted. Additional ultrasound  was performed. COMPLICATIONS: None. FINDINGS: There is a huge tumor burden throughout the liver as seen by recent CT. Solid tissue was obtained from a lesion in the right lobe. IMPRESSION: Ultrasound-guided core biopsy performed of a mass lesion within the right lobe of the liver. Electronically Signed   By: Aletta Edouard M.D.   On: 05/20/2018 14:36

## 2018-06-17 NOTE — ED Notes (Signed)
Attempted to contact pt's son, Verdie Barrows, with no answer.

## 2018-06-17 NOTE — ED Triage Notes (Signed)
Pt via EMS from home with c/o weakness x47month, was seen here x23month ago for same symptoms. Per EMS pt noncompliant with follow up and meds. Hx of HTN. VSS, A&OX4

## 2018-06-19 ENCOUNTER — Encounter: Payer: Self-pay | Admitting: Emergency Medicine

## 2018-06-19 ENCOUNTER — Other Ambulatory Visit: Payer: Self-pay

## 2018-06-19 ENCOUNTER — Emergency Department
Admission: EM | Admit: 2018-06-19 | Discharge: 2018-06-19 | Disposition: A | Discharge status: Home / Self Care | Payer: 59 | Attending: Emergency Medicine | Admitting: Emergency Medicine

## 2018-06-19 DIAGNOSIS — E119 Type 2 diabetes mellitus without complications: Secondary | ICD-10-CM | POA: Insufficient documentation

## 2018-06-19 DIAGNOSIS — Z5900 Homelessness unspecified: Secondary | ICD-10-CM

## 2018-06-19 DIAGNOSIS — I10 Essential (primary) hypertension: Secondary | ICD-10-CM | POA: Diagnosis not present

## 2018-06-19 DIAGNOSIS — Z79899 Other long term (current) drug therapy: Secondary | ICD-10-CM | POA: Insufficient documentation

## 2018-06-19 DIAGNOSIS — Z7984 Long term (current) use of oral hypoglycemic drugs: Secondary | ICD-10-CM | POA: Diagnosis not present

## 2018-06-19 LAB — COMPREHENSIVE METABOLIC PANEL
ALBUMIN: 4.2 g/dL (ref 3.5–5.0)
ALK PHOS: 204 U/L — AB (ref 38–126)
ALT: 27 U/L (ref 0–44)
AST: 66 U/L — AB (ref 15–41)
Anion gap: 11 (ref 5–15)
BUN: 17 mg/dL (ref 6–20)
CALCIUM: 9.5 mg/dL (ref 8.9–10.3)
CO2: 25 mmol/L (ref 22–32)
CREATININE: 1.25 mg/dL — AB (ref 0.61–1.24)
Chloride: 96 mmol/L — ABNORMAL LOW (ref 98–111)
GFR calc Af Amer: >60 mL/min (ref >60)
GFR calc non Af Amer: >60 mL/min (ref >60)
GLUCOSE: 118 mg/dL — AB (ref 70–99)
Potassium: 3.7 mmol/L (ref 3.5–5.1)
SODIUM: 132 mmol/L — AB (ref 135–145)
TOTAL PROTEIN: 8.3 g/dL — AB (ref 6.5–8.1)
Total Bilirubin: 1 mg/dL (ref 0.3–1.2)

## 2018-06-19 LAB — CBC WITH DIFFERENTIAL/PLATELET
BASOS ABS: 0.1 10*3/uL (ref 0–0.1)
BASOS PCT: 1 %
EOS PCT: 0 %
Eosinophils Absolute: 0 10*3/uL (ref 0–0.7)
HCT: 32.9 % — ABNORMAL LOW (ref 40.0–52.0)
Hemoglobin: 11 g/dL — ABNORMAL LOW (ref 13.0–18.0)
Lymphocytes Relative: 5 %
Lymphs Abs: 0.4 10*3/uL — ABNORMAL LOW (ref 1.0–3.6)
MCH: 26.6 pg (ref 26.0–34.0)
MCHC: 33.6 g/dL (ref 32.0–36.0)
MCV: 79.2 fL — AB (ref 80.0–100.0)
MONO ABS: 0.6 10*3/uL (ref 0.2–1.0)
MONOS PCT: 8 %
Neutro Abs: 6.5 10*3/uL (ref 1.4–6.5)
Neutrophils Relative %: 86 %
PLATELETS: 389 10*3/uL (ref 150–440)
RBC: 4.15 MIL/uL — ABNORMAL LOW (ref 4.40–5.90)
RDW: 17.6 % — AB (ref 11.5–14.5)
WBC: 7.7 10*3/uL (ref 3.8–10.6)

## 2018-06-19 LAB — ETHANOL: Alcohol, Ethyl (B): 129 mg/dL — ABNORMAL HIGH (ref <10)

## 2018-06-19 LAB — URINE DRUG SCREEN, QUALITATIVE (ARMC ONLY)
Amphetamines, Ur Screen: NOT DETECTED
BARBITURATES, UR SCREEN: NOT DETECTED
COCAINE METABOLITE, UR ~~LOC~~: POSITIVE — AB
Cannabinoid 50 Ng, Ur ~~LOC~~: NOT DETECTED
MDMA (Ecstasy)Ur Screen: NOT DETECTED
Methadone Scn, Ur: NOT DETECTED
Opiate, Ur Screen: NOT DETECTED
PHENCYCLIDINE (PCP) UR S: NOT DETECTED
Tricyclic, Ur Screen: NOT DETECTED
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
## 2018-06-19 NOTE — BH Assessment (Signed)
Per request of Dr. Kerman Passey, Probation officer provided pt homeless resources for [place]
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
## 2018-06-20 NOTE — Progress Notes (Deleted)
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  INTERVAL HISTORY: Patient returns to clinic today for further evaluation and treatment planning.  He has increased weakness and fatigue.  He continues to have abdominal pain and a poor appetite.  He has increased nausea.  He has no neurologic complaints.  He denies any recent fevers.  He has no chest pain or shortness of breath.  He denies any vomiting, constipation, or diarrhea.  He has no melena or hematochezia.  He has no urinary complaints.  Patient offers no further specific complaints today.  REVIEW OF SYSTEMS:   Review of Systems  Constitutional: Positive for malaise/fatigue. Negative for fever and weight loss.  Respiratory: Negative.  Negative for cough, hemoptysis and shortness of breath.   Cardiovascular: Negative.  Negative for chest pain and leg swelling.  Gastrointestinal: Positive for nausea. Negative for abdominal pain, blood in stool and melena.  Genitourinary: Negative.  Negative for dysuria.  Musculoskeletal: Negative.  Negative for back pain.  Skin: Negative.  Negative for rash.  Neurological: Positive for weakness. Negative for sensory change, focal weakness and headaches.  Psychiatric/Behavioral: Negative.  The patient is not nervous/anxious.     As per HPI. Otherwise, a complete review of systems is negative.  PAST MEDICAL HISTORY: Past Medical History:  Diagnosis Date  . Anxiety   . Cataract   . Depression   . Diabetes mellitus without complication ([place])   . GERD (gastroesophageal reflux disease)   . Hypertension     PAST SURGICAL HISTORY: Past Surgical History:  Procedure Laterality Date  . BOWEL RESECTION  10/2017    FAMILY  HISTORY: No family history on file.  ADVANCED DIRECTIVES (Y/N):  N  HEALTH MAINTENANCE: Social History   Tobacco Use  . Smoking status: Never Smoker  . Smokeless tobacco: Never Used  Substance Use Topics  . Alcohol use: Yes    Alcohol/week: 6.0 standard drinks    Types: 6 Cans of beer per week  . 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 diphenoxylate-atropine (LOMOTIL) 2.5-0.025 MG tablet Take 1 tablet by mouth 4 (four) times daily as needed for diarrhea or loose stools. (Patient not taking: Reported on 05/20/2018) 30 tablet 0  . hydrochlorothiazide (HYDRODIURIL) 25 MG tablet Take 1 tablet (25 mg total) by mouth daily. 30 tablet 1  . lidocaine-prilocaine (EMLA) cream Apply 1 application topically as needed. Apply to port 1-2 hours prior to chemotherapy appointment. Cover with plastic wrap. 30 g 1  . lidocaine-prilocaine (EMLA) cream Apply to affected area once 30 g 3  . loperamide (IMODIUM A-D) 2 MG tablet Take 1 tablet (2 mg total) by mouth 4 (four) times daily as needed for diarrhea or loose stools. (Patient not taking: Reported on 06/02/2018) 30 tablet 0  . metFORMIN (GLUCOPHAGE) 1000 MG tablet Take 500 mg by mouth 2 (two) times daily with a meal.    . ondansetron (ZOFRAN) 8 MG tablet Take 1 tablet (8 mg total) by mouth every 8 (eight) hours as needed for  nausea or vomiting. 30 tablet 0  . oxyCODONE-acetaminophen (PERCOCET) 5-325 MG tablet Take 1 tablet by mouth every 6 (six) hours as needed for severe pain. 15 tablet 0  . prochlorperazine (COMPAZINE) 10 MG tablet Take 1 tablet (10 mg total) by mouth every 6 (six) hours as needed for nausea or vomiting. 30 tablet 0  . traMADol (ULTRAM) 50 MG tablet Take 1 tablet (50 mg total) by mouth every 6 (six) hours as needed. Please fill on the  [facility]rd fund 30 tablet 0   No current facility-administered medications for this visit.     OBJECTIVE: There were no vitals filed for this visit.   There is no height or weight on file to calculate BMI.    ECOG FS:0 - Asymptomatic  General: Well-developed, well-nourished, no acute distress. Eyes: Pink conjunctiva, anicteric sclera. HEENT: Normocephalic, moist mucous membranes. Lungs: Clear to auscultation bilaterally. Heart: Regular rate and rhythm. No rubs, murmurs, or gallops. Abdomen: Soft, nontender, nondistended. No organomegaly noted, normoactive bowel sounds. Musculoskeletal: No edema, cyanosis, or clubbing. Neuro: Alert, answering all questions appropriately. Cranial nerves grossly intact. Skin: No rashes or petechiae noted. Psych: Normal affect.  LAB RESULTS:  Lab Results  Component Value Date   NA 132 (L) 06/19/2018   K 3.7 06/19/2018   CL 96 (L) 06/19/2018   CO2 25 06/19/2018   GLUCOSE 118 (H) 06/19/2018   BUN 17 06/19/2018   CREATININE 1.25 (H) 06/19/2018   CALCIUM 9.5 06/19/2018   PROT 8.3 (H) 06/19/2018   ALBUMIN 4.2 06/19/2018   AST 66 (H) 06/19/2018   ALT 27 06/19/2018   ALKPHOS 204 (H) 06/19/2018   BILITOT 1.0 06/19/2018   GFRNONAA >60 06/19/2018   GFRAA >60 06/19/2018    Lab Results  Component Value Date   WBC 7.7 06/19/2018   NEUTROABS 6.5 06/19/2018   HGB 11.0 (L) 06/19/2018   HCT 32.9 (L) 06/19/2018   MCV 79.2 (L) 06/19/2018   PLT 389 06/19/2018     STUDIES: No results found. ONCOLOGY HISTORY: The primary rectosigmoid tumor was resected in July 2018, mucinous adenocarcinoma pT4a pN2b.  MMR IHC and MSI testing were performed on the primary tumor with the following results:   MLH1: Normal expression.  PMS2: Normal expression.  MSH2: Normal expression.  MSH6: Normal expression.  MSI testing by PCR: Microsatellite stable (MSS).   ASSESSMENT: Stage IVa mucinous adenocarcinoma of colon.  PLAN:    1. Stage IVa mucinous adenocarcinoma of  colon: See original pathology results as above.  Patient's recent liver biopsy on May 20, 2018 was consistent with outside pathology.  Patient was scheduled for chemotherapy, but was unable to attend appointments and noncompliant with follow-up.  CT scan results from May 07, 2018 reviewed independently and report as above with large confluent liver masses likely due to metastatic disease.  Patient CA-19-9 and CEA are both elevated.  Patient is agreed to pursue palliative chemotherapy with FOLFOX plus Avastin.  He will require port placement prior to treatment.  Return to clinic on June 14, 2018 for further evaluation and consideration of cycle 1.  2.  Anemia: Patient's most recent hemoglobin 10.9.  Monitor.  I spent a total of 30 minutes face-to-face with the patient of which greater than 50% of the visit was spent in counseling and coordination of care as detailed above.   Patient expressed understanding and was in agreement with this plan. He also understands that He can call clinic at any time with any questions, concerns, or complaints.  Cancer Staging Colon cancer [facility]) Staging form: Colon and Rectum, AJCC 8th Edition - Clinical stage from 06/06/2018: Stage IVA (cT4a, cN2b, pM1a) - Signed by Lloyd Huger, MD on 06/06/2018   Lloyd Huger, MD   06/20/2018 11:07 AM

## 2018-06-23 ENCOUNTER — Inpatient Hospital Stay: Payer: 59

## 2018-06-23 ENCOUNTER — Inpatient Hospital Stay: Payer: 59 | Admitting: Oncology

## 2018-12-30 IMAGING — CT CT ABD-PELV W/ CM
2 of 5 series · 15 of 46 positions shown, 17 images · IV contrast (APPLIED)
Comparison: None.

CLINICAL DATA: Nausea, vomiting and diarrhea. Patient diagnosed
with colon cancer in October 2017. Status post colon resection.

EXAM:
CT ABDOMEN AND PELVIS WITH CONTRAST
TECHNIQUE: Multidetector CT imaging of the abdomen and pelvis was performed
using the standard protocol following bolus administration of
intravenous contrast.
CONTRAST:  100 ml OMNIPAQUE IOHEXOL 300 MG/ML  SOLN

[Series 2: routine abd/pel with · axial · 0.73mm/px · z∈[-260,+180]mm · 12 of 100 slices shown, 14 images]
[im 6/100  soft-tissue]
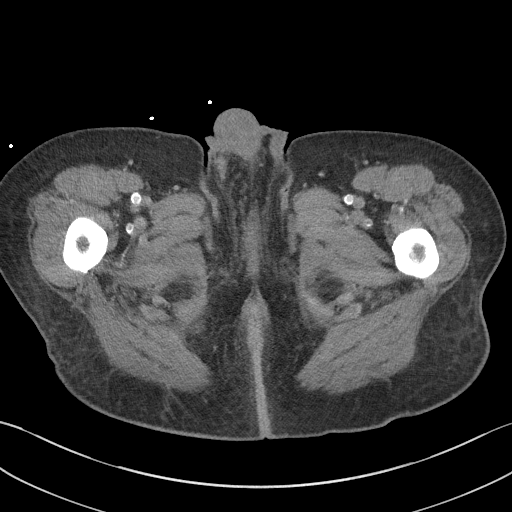
[im 6/100  bone]
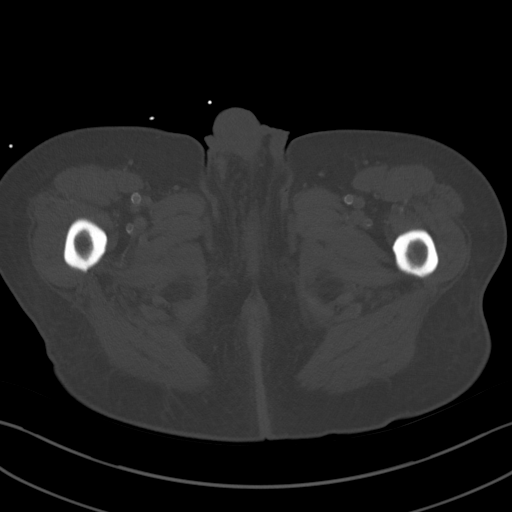
[im 18/100  soft-tissue]
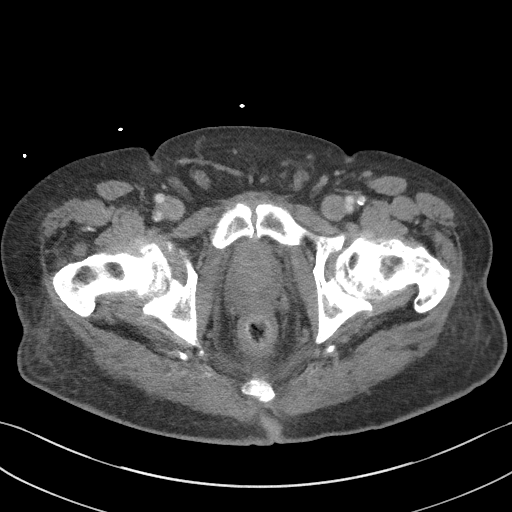
[im 24/100  soft-tissue]
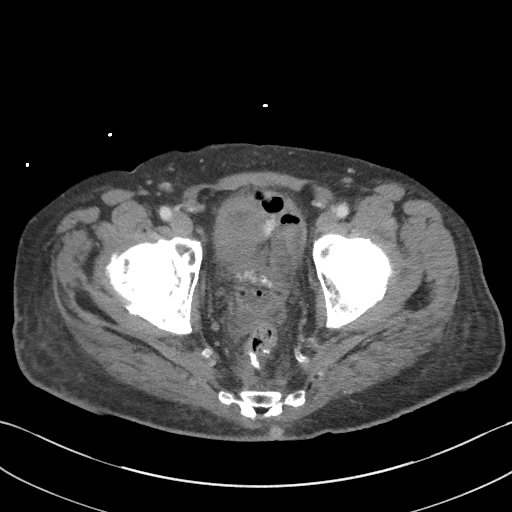
[im 30/100  soft-tissue]
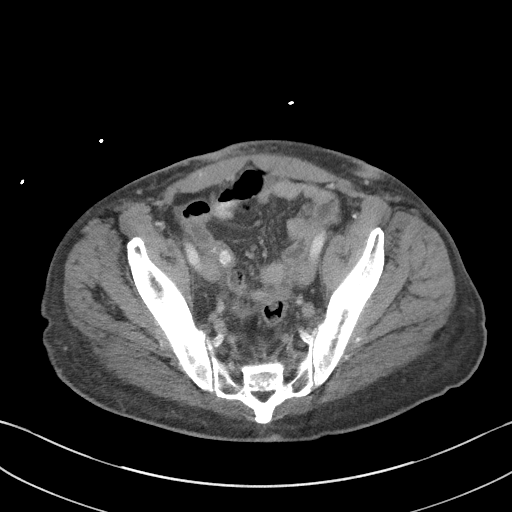
[im 41/100  soft-tissue]
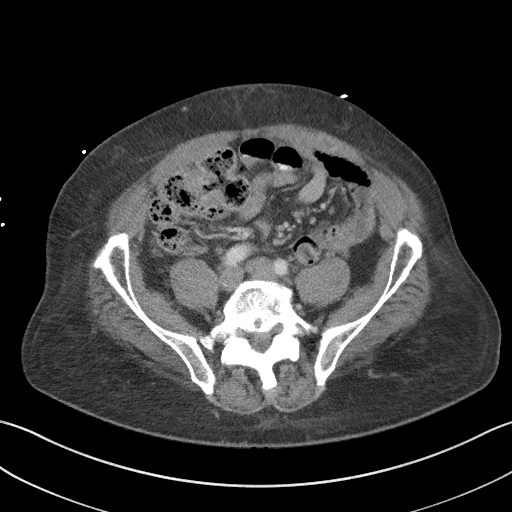
[im 47/100  soft-tissue]
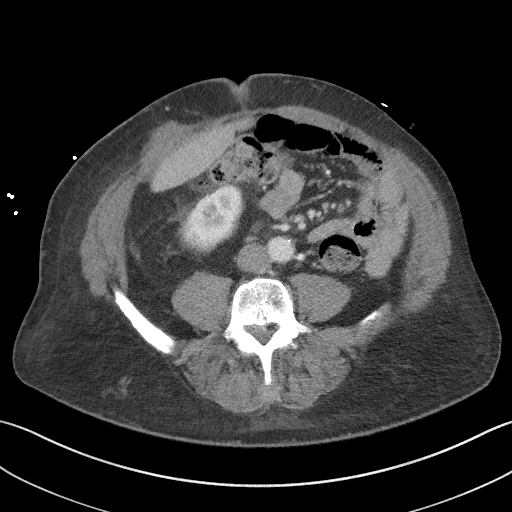
[im 53/100  soft-tissue]
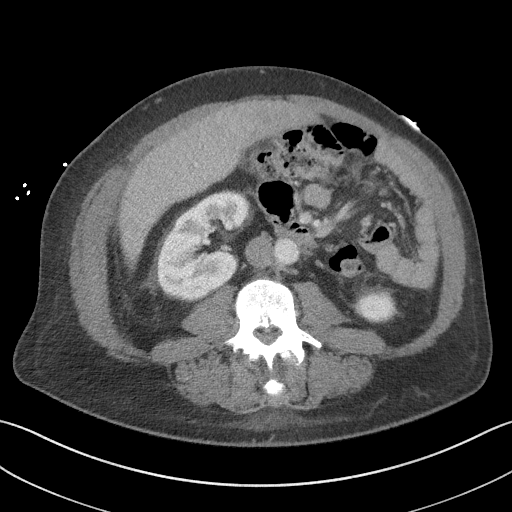
[im 65/100  soft-tissue]
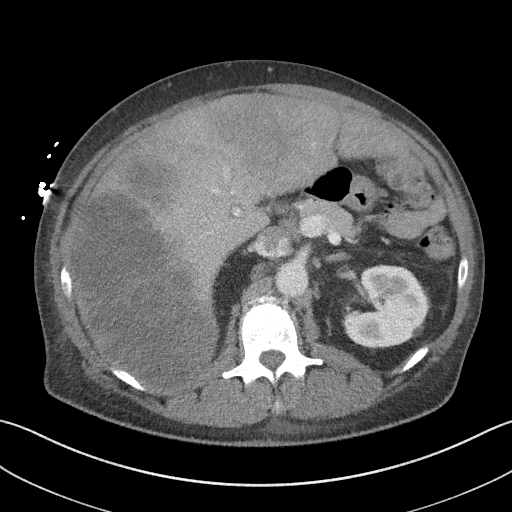
[im 70/100  soft-tissue]
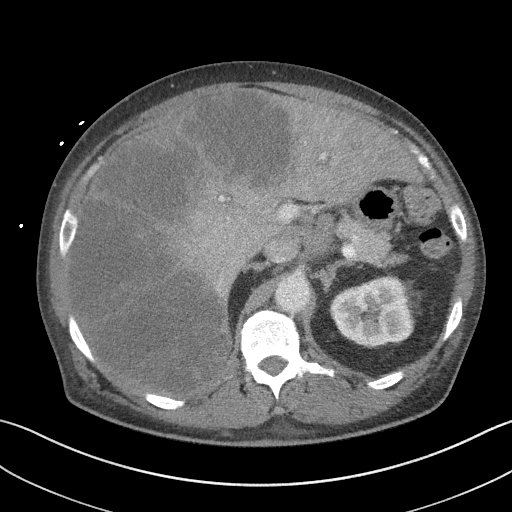
[im 70/100  bone]
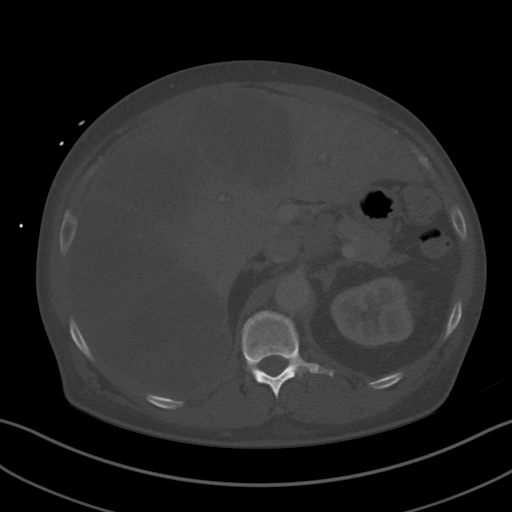
[im 76/100  soft-tissue]
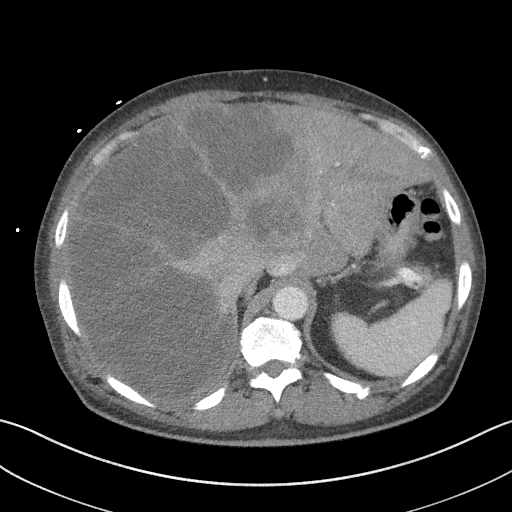
[im 88/100  soft-tissue]
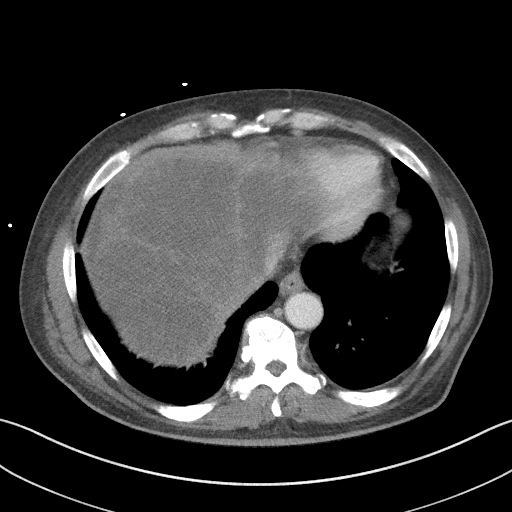
[im 94/100  soft-tissue]
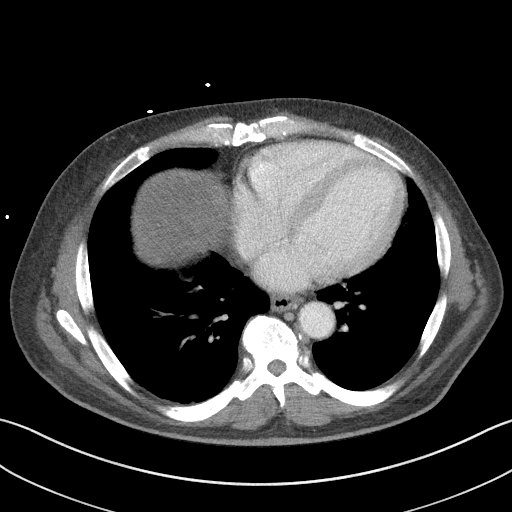

[Series 5: coronal st · coronal · 0.76mm/px · 3 of 92 slices shown]
[im 31/92  soft-tissue]
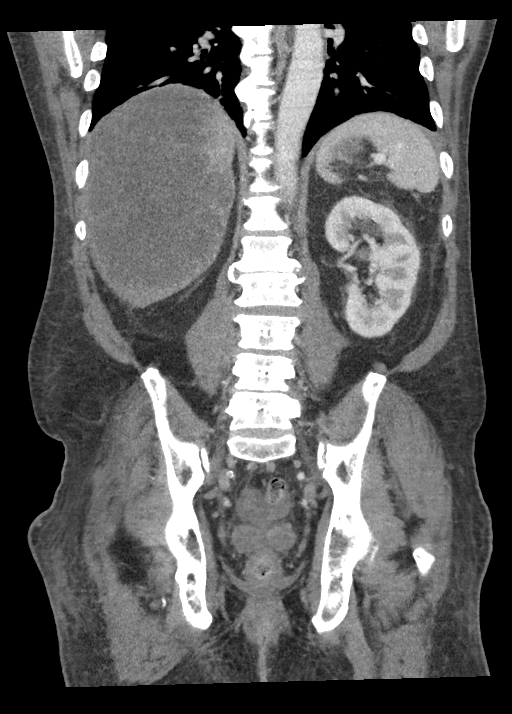
[im 41/92  soft-tissue]
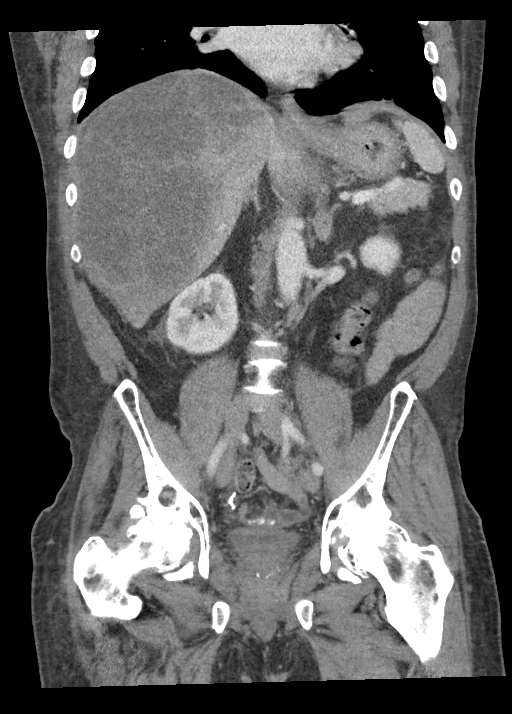
[im 51/92  soft-tissue]
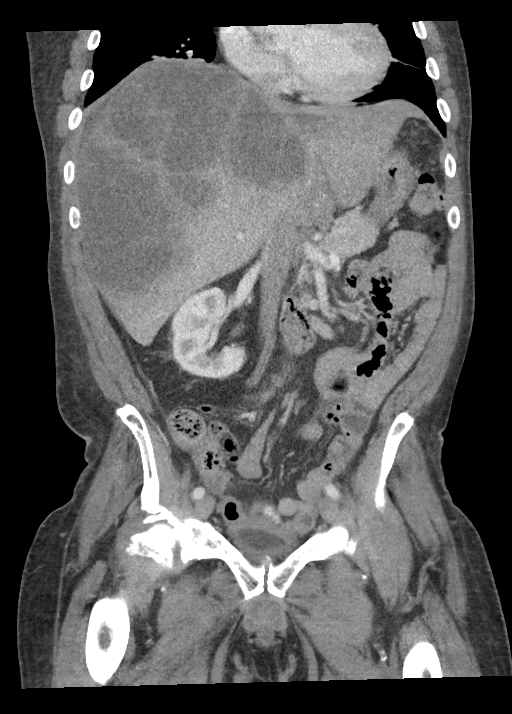

[15 of 46 positions shown; findings below may reference images not displayed]

FINDINGS: Lower chest: Heart size is upper normal. No pleural or pericardial
effusion. Mild dependent atelectasis is seen in the lung bases.

Hepatobiliary: Multiple large confluent masses are identified in the
liver. For example, lesion in the posterior right hepatic lobe
measures 10 cm transverse by 11 cm AP on image 18. The liver is
enlarged by these masses measuring 24 cm craniocaudal and displacing
the pancreas to the left and right kidney inferiorly. The
gallbladder and biliary tree are unremarkable.

Pancreas: Unremarkable. No pancreatic ductal dilatation or
surrounding inflammatory changes.

Spleen: Normal in size without focal abnormality.

Adrenals/Urinary Tract: Nonobstructing stone midpole left kidney
measures 0.5 cm in diameter. The kidneys are otherwise unremarkable.
Wall thickening is seen in almost completely decompressed urinary
bladder. The adrenal glands are unremarkable.

Stomach/Bowel: Surgical anastomosis near the rectosigmoid colon is
consistent with prior colon resection. There also appears to be a
small bowel anastomosis in the pelvis. No bowel obstruction or
inflammatory change. The stomach is unremarkable. The appendix is
normal.

Vascular/Lymphatic: Scattered atherosclerotic calcifications are
seen. No lymphadenopathy.

Reproductive: Prostate is unremarkable.

Other: No intra-abdominal fluid collection or free air.

Musculoskeletal: No lytic or sclerotic lesion is identified. Severe
bilateral hip osteoarthritis is advanced for age. Degenerative disc
disease L1-2 and L4-5 is also identified.
IMPRESSION: Multiple large confluent liver masses most likely due to metastatic
disease in this patient with a history of colon cancer. The masses
result in hepatomegaly and inferior displacement of the right kidney
and leftward displacement of the pancreas.

Postoperative change of the colon without evidence of complication.

Nonobstructing stone midpole left kidney.

Wall thickening of the urinary bladder is likely related to
underdistention rather than cystitis.

Severe right hip osteoarthritis severe bilateral hip osteoarthritis.

## 2019-01-12 IMAGING — US US BIOPSY CORE LIVER
1 series · 12 of 12 positions shown · non-contrast
Comparison: CT of the abdomen on 05/07/2018

CLINICAL DATA: History of colorectal carcinoma with large hepatic
mass lesions. Biopsy of the liver is performed to confirm presence
of metastatic disease.

EXAM:
ULTRASOUND GUIDED CORE BIOPSY OF LIVER

[Series 1: us biopsy core liver · 12 of 12 slices shown]
[im 1/12]
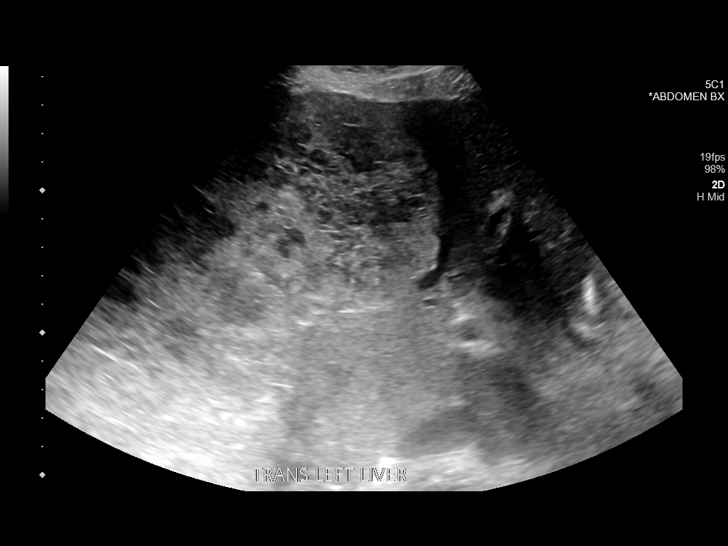
[im 2/12]
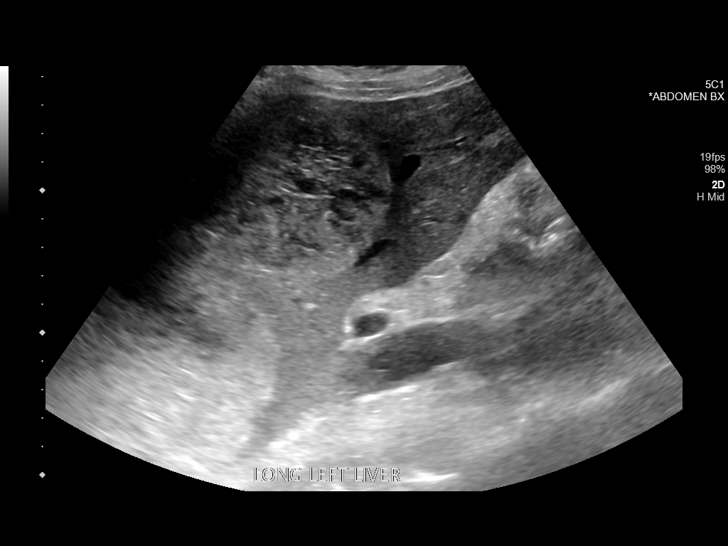
[im 3/12]
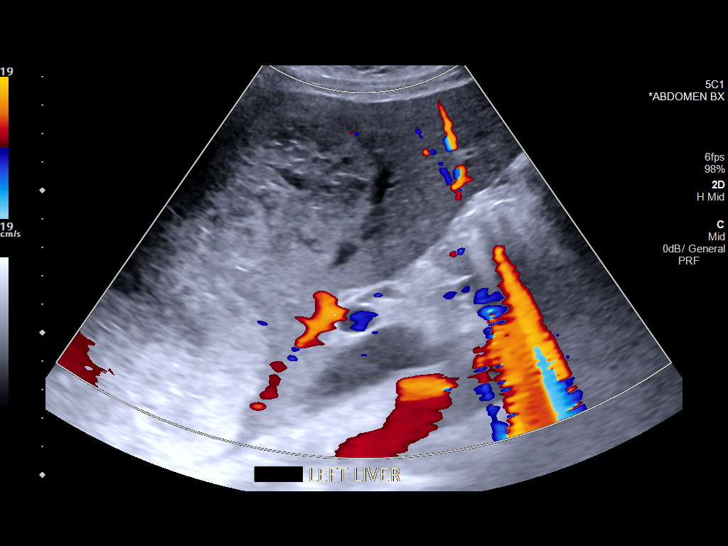
[im 4/12]
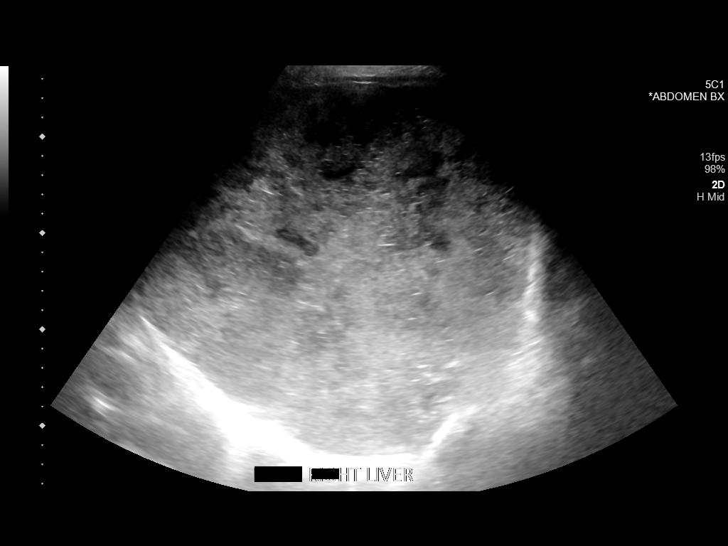
[im 5/12]
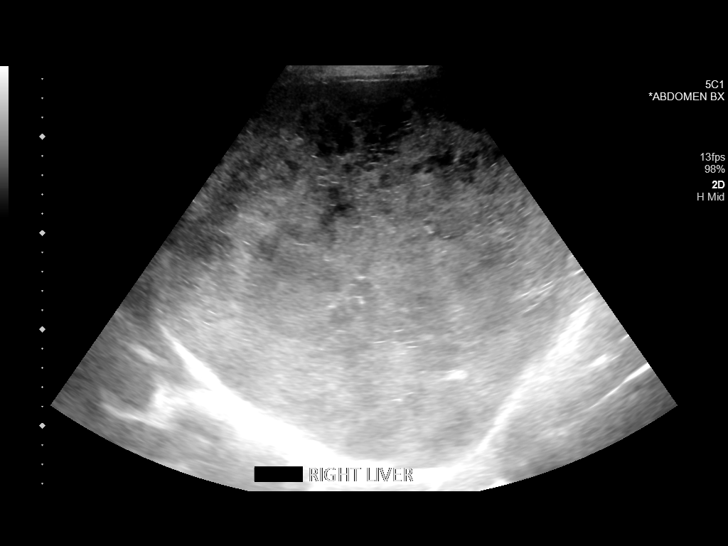
[im 6/12]
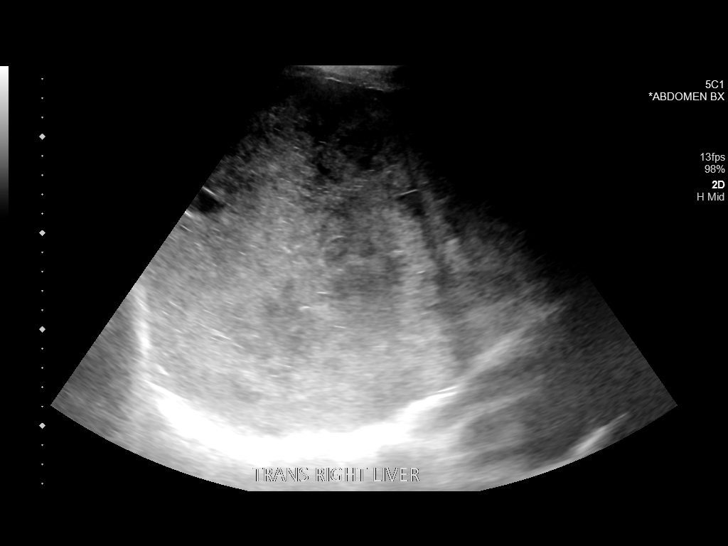
[im 7/12]
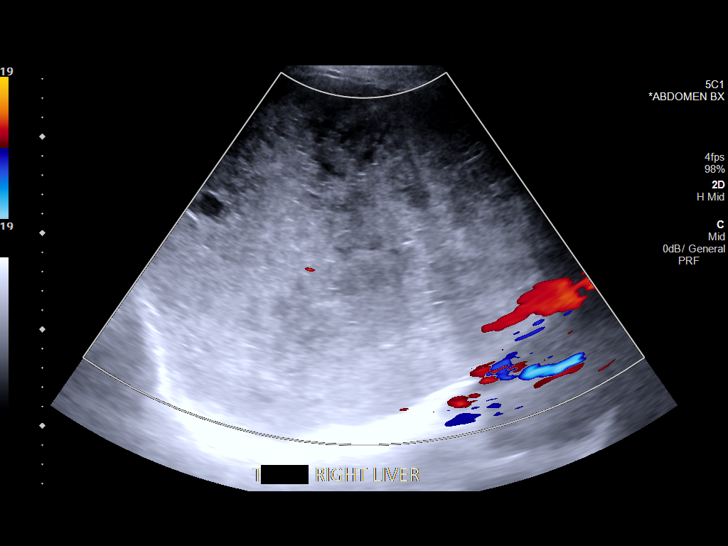
[im 8/12]
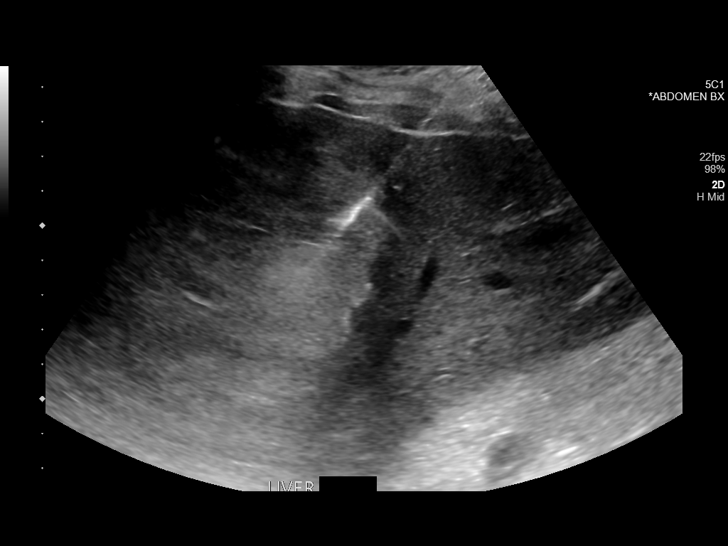
[im 9/12]
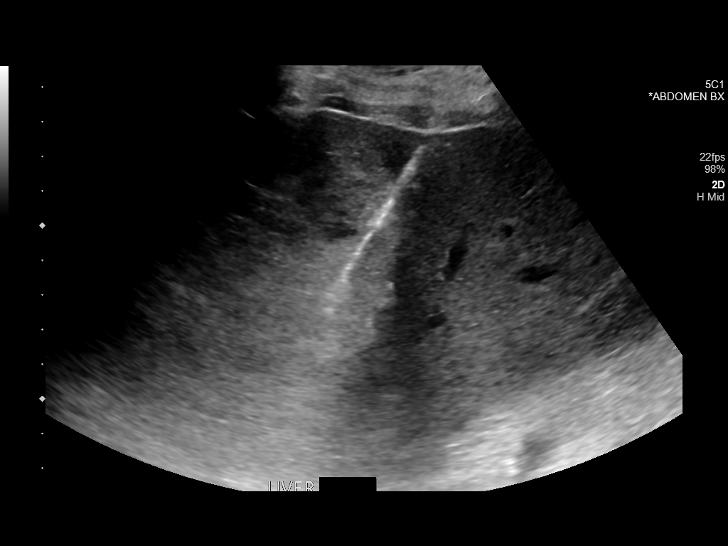
[im 10/12]
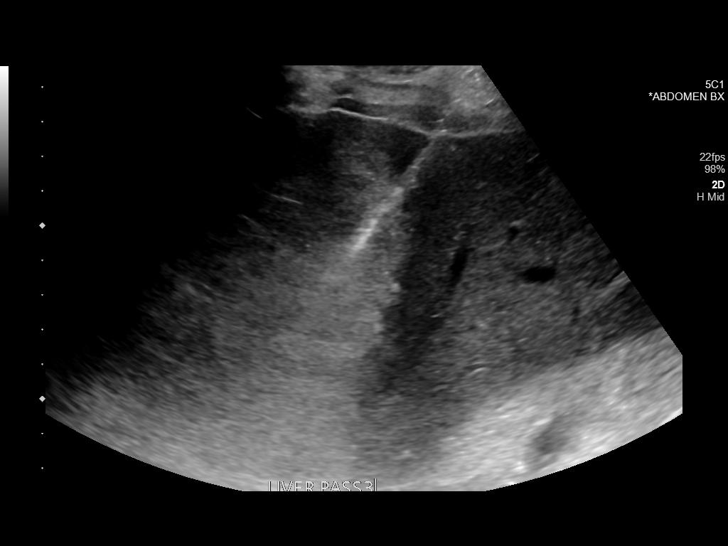
[im 11/12]
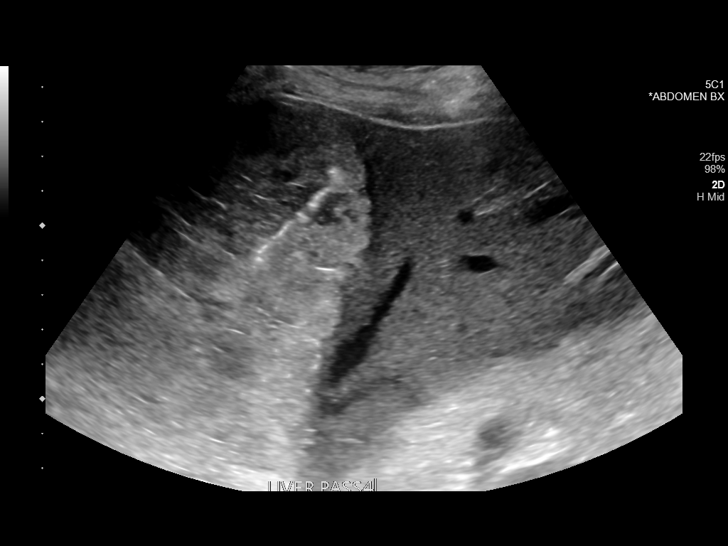
[im 12/12]
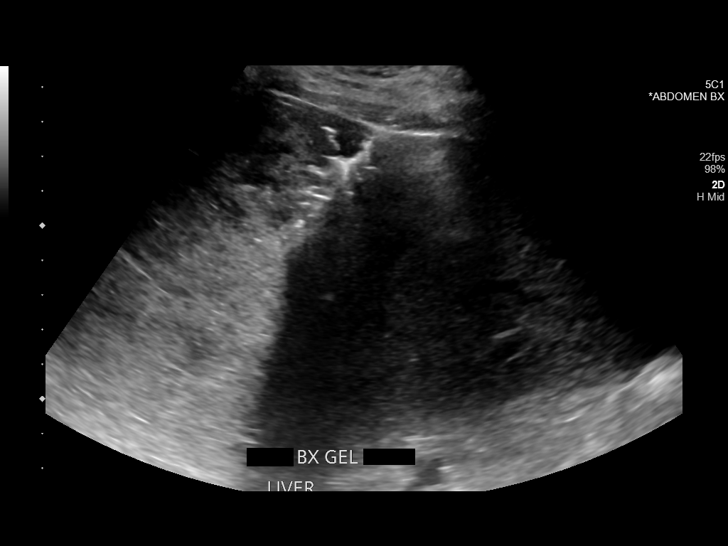

[12 of 12 positions shown; findings below may reference images not displayed]

MEDICATIONS:
1.0 mg IV Versed; 50 mcg IV Fentanyl

Total Moderate Sedation Time: 14 minutes.

The patient's level of consciousness and physiologic status were
continuously monitored during the procedure by Radiology nursing.

PROCEDURE:
The procedure, risks, benefits, and alternatives were explained to
the patient. Questions regarding the procedure were encouraged and
answered. The patient understands and consents to the procedure. A
time out was performed prior to initiating the procedure.

Ultrasound was used to localize lesions in the liver. The abdominal
wall was prepped with chlorhexidine in a sterile fashion, and a
sterile drape was applied covering the operative field. A sterile
gown and sterile gloves were used for the procedure. Local
anesthesia was provided with 1% Lidocaine.

Under direct ultrasound guidance, a 17 gauge trocar needle was
advanced into the right lobe of the liver. 18 gauge core biopsy
samples were obtained at the level of a hepatic mass. Four samples
were obtained and submitted in formalin. After the procedure
Gel-Foam pledgets were advanced through the outer needle as it was
retracted. Additional ultrasound was performed.

COMPLICATIONS:
None.
FINDINGS: There is a huge tumor burden throughout the liver as seen by recent
CT. Solid tissue was obtained from a lesion in the right lobe.
IMPRESSION: Ultrasound-guided core biopsy performed of a mass lesion within the
right lobe of the liver.

## 2019-12-26 DEATH — deceased
